# Patient Record
Sex: Male | Born: 1940 | Race: Black or African American | Hispanic: No | Marital: Married | Smoking: Current every day smoker
Health system: Southern US, Community
[De-identification: ages and names within clinical notes are randomized; demographics above are authoritative.]

## PROBLEM LIST (undated history)

## (undated) DIAGNOSIS — I10 Essential (primary) hypertension: Secondary | ICD-10-CM

## (undated) DIAGNOSIS — N183 Chronic kidney disease, stage 3 unspecified: Secondary | ICD-10-CM

## (undated) DIAGNOSIS — D472 Monoclonal gammopathy: Principal | ICD-10-CM

## (undated) DIAGNOSIS — I679 Cerebrovascular disease, unspecified: Secondary | ICD-10-CM

## (undated) DIAGNOSIS — E7800 Pure hypercholesterolemia, unspecified: Secondary | ICD-10-CM

## (undated) DIAGNOSIS — C61 Malignant neoplasm of prostate: Secondary | ICD-10-CM

## (undated) DIAGNOSIS — I639 Cerebral infarction, unspecified: Secondary | ICD-10-CM

## (undated) DIAGNOSIS — I671 Cerebral aneurysm, nonruptured: Secondary | ICD-10-CM

## (undated) HISTORY — DX: Monoclonal gammopathy: D47.2

## (undated) HISTORY — PX: CHOLECYSTECTOMY: SHX55

## (undated) HISTORY — PX: ANKLE SURGERY: SHX546

## (undated) HISTORY — PX: OTHER SURGICAL HISTORY: SHX169

---

## 2008-02-29 ENCOUNTER — Emergency Department (HOSPITAL_COMMUNITY): Admission: EM | Admit: 2008-02-29 | Discharge: 2008-02-29 | Payer: Self-pay | Admitting: Emergency Medicine

## 2009-11-12 ENCOUNTER — Emergency Department (HOSPITAL_COMMUNITY): Admission: EM | Admit: 2009-11-12 | Discharge: 2009-11-12 | Payer: Self-pay | Admitting: Emergency Medicine

## 2009-11-20 ENCOUNTER — Emergency Department (HOSPITAL_COMMUNITY): Admission: EM | Admit: 2009-11-20 | Discharge: 2009-11-20 | Payer: Self-pay | Admitting: Emergency Medicine

## 2009-12-24 ENCOUNTER — Emergency Department (HOSPITAL_COMMUNITY): Admission: EM | Admit: 2009-12-24 | Discharge: 2009-12-24 | Payer: Self-pay | Admitting: Emergency Medicine

## 2010-06-27 LAB — CBC
HCT: 38.6 % — ABNORMAL LOW (ref 39.0–52.0)
Hemoglobin: 13.3 g/dL (ref 13.0–17.0)
MCH: 31.2 pg (ref 26.0–34.0)
MCHC: 34.5 g/dL (ref 30.0–36.0)
RBC: 4.27 MIL/uL (ref 4.22–5.81)

## 2010-06-27 LAB — COMPREHENSIVE METABOLIC PANEL
Albumin: 4 g/dL (ref 3.5–5.2)
BUN: 21 mg/dL (ref 6–23)
CO2: 26 mEq/L (ref 19–32)
Calcium: 9.5 mg/dL (ref 8.4–10.5)
Chloride: 103 mEq/L (ref 96–112)
GFR calc Af Amer: 28 mL/min — ABNORMAL LOW (ref >60)
GFR calc non Af Amer: 23 mL/min — ABNORMAL LOW (ref >60)
Glucose, Bld: 98 mg/dL (ref 70–99)
Sodium: 139 mEq/L (ref 135–145)

## 2010-06-27 LAB — POCT CARDIAC MARKERS
CKMB, poc: 4.4 ng/mL (ref 1.0–8.0)
Myoglobin, poc: 234 ng/mL (ref 12–200)
Troponin i, poc: <0.05 ng/mL (ref 0.00–0.09)

## 2010-06-27 LAB — DIFFERENTIAL
Basophils Absolute: 0.1 10*3/uL (ref 0.0–0.1)
Basophils Relative: 1 % (ref 0–1)
Eosinophils Absolute: 0.1 10*3/uL (ref 0.0–0.7)
Eosinophils Relative: 1 % (ref 0–5)
Monocytes Relative: 10 % (ref 3–12)

## 2010-06-27 LAB — URINALYSIS, ROUTINE W REFLEX MICROSCOPIC
Glucose, UA: NEGATIVE mg/dL
Hgb urine dipstick: NEGATIVE
Ketones, ur: NEGATIVE mg/dL

## 2011-06-11 ENCOUNTER — Emergency Department (HOSPITAL_COMMUNITY): Payer: Medicare Other

## 2011-06-11 ENCOUNTER — Emergency Department (HOSPITAL_COMMUNITY)
Admission: EM | Admit: 2011-06-11 | Discharge: 2011-06-11 | Disposition: A | Discharge status: Home / Self Care | Payer: Medicare Other | Attending: Emergency Medicine | Admitting: Emergency Medicine

## 2011-06-11 ENCOUNTER — Encounter (HOSPITAL_COMMUNITY): Payer: Self-pay | Admitting: *Deleted

## 2011-06-11 DIAGNOSIS — R0602 Shortness of breath: Secondary | ICD-10-CM | POA: Insufficient documentation

## 2011-06-11 DIAGNOSIS — Z8673 Personal history of transient ischemic attack (TIA), and cerebral infarction without residual deficits: Secondary | ICD-10-CM | POA: Insufficient documentation

## 2011-06-11 DIAGNOSIS — J40 Bronchitis, not specified as acute or chronic: Secondary | ICD-10-CM | POA: Insufficient documentation

## 2011-06-11 DIAGNOSIS — I10 Essential (primary) hypertension: Secondary | ICD-10-CM | POA: Insufficient documentation

## 2011-06-11 DIAGNOSIS — R51 Headache: Secondary | ICD-10-CM | POA: Insufficient documentation

## 2011-06-11 HISTORY — DX: Cerebral aneurysm, nonruptured: I67.1

## 2011-06-11 HISTORY — DX: Cerebral infarction, unspecified: I63.9

## 2011-06-11 HISTORY — DX: Essential (primary) hypertension: I10

## 2011-06-11 LAB — BASIC METABOLIC PANEL
CO2: 24 mEq/L (ref 19–32)
GFR calc Af Amer: 34 mL/min — ABNORMAL LOW (ref >90)
GFR calc non Af Amer: 29 mL/min — ABNORMAL LOW (ref >90)
Sodium: 137 mEq/L (ref 135–145)

## 2011-06-11 LAB — CBC
HCT: 37.7 % — ABNORMAL LOW (ref 39.0–52.0)
Hemoglobin: 12.9 g/dL — ABNORMAL LOW (ref 13.0–17.0)
MCH: 30 pg (ref 26.0–34.0)
MCHC: 34.2 g/dL (ref 30.0–36.0)
RBC: 4.3 MIL/uL (ref 4.22–5.81)
RDW: 14.3 % (ref 11.5–15.5)
WBC: 8.3 10*3/uL (ref 4.0–10.5)

## 2011-06-11 LAB — DIFFERENTIAL
Basophils Absolute: 0 10*3/uL (ref 0.0–0.1)
Eosinophils Absolute: 0.3 10*3/uL (ref 0.0–0.7)
Lymphocytes Relative: 29 % (ref 12–46)
Lymphs Abs: 2.4 10*3/uL (ref 0.7–4.0)
Neutro Abs: 4.9 10*3/uL (ref 1.7–7.7)

## 2011-06-11 MED ORDER — AZITHROMYCIN 250 MG PO TABS
500.0000 mg | ORAL_TABLET | Freq: Once | ORAL | Status: AC
  Administered 2011-06-11: 500 mg via ORAL
  Filled 2011-06-11: qty 2

## 2011-06-11 MED ORDER — BENZONATATE 100 MG PO CAPS: 200.0000 mg | ORAL_CAPSULE | Freq: Two times a day (BID) | ORAL | Status: AC | PRN

## 2011-06-11 MED ORDER — ALBUTEROL SULFATE HFA 108 (90 BASE) MCG/ACT IN AERS: 2.0000 | INHALATION_SPRAY | RESPIRATORY_TRACT | Status: DC | PRN

## 2011-06-11 MED ORDER — AZITHROMYCIN 250 MG PO TABS: 250.0000 mg | ORAL_TABLET | Freq: Every day | ORAL | Status: AC

## 2011-06-11 NOTE — Discharge Instructions (Signed)
Your chest x-ray was normal showing no signs of pneumonia, your blood work showed that your kidneys are improved since 2011. I do want you to call your doctor in the morning to schedule a followup appointment within the next 24-48 hours to make sure that you're improving. If you should develop severe or worsening symptoms return to the emergency department immediately for repeat evaluation and treatment  RESOURCE GUIDE  Dental Problems  Patients with Medicaid: [facility] [phone] [address]                                           [phone] [address] Phone:  [phone]                                                  Phone:  [phone]  If unable to pay or uninsured, contact:  [facility] or [facility]. to become qualified for the adult dental clinic.  Chronic Pain Problems Contact [facility]  [phone] Patients need to be referred by their primary care doctor.  Insufficient Money for Medicine Contact [employer] Way:  call "211" or [facility] 8730735053.  No Primary Care Doctor Call [facility]  [phone] Other agencies that provide inexpensive medical care    [facility]  [phone]    [facility]  [phone]    [facility]  [phone]    [facility]  [phone]    [facility]  [phone]    [facility]  [phone]  Psychological Services [facility] Behavioral Health  [phone] [facility] Services  [phone] [facility] Mental Health   [phone] (emergency services [phone])  Substance Abuse Resources [facility]  [phone] [facility] [phone] [facility] [place] [phone] Floydene Flock [phone] Residential & Outpatient Substance Abuse Program  [phone]  Abuse/Neglect [facility] Child Abuse Hotline 5793217714 [facility] Child Abuse Hotline 986-139-0506 (After  Hours)  Emergency Shelter [facility] [phone]  Maternity Homes [facility] [phone] [facility] [phone]  MRSA Hotline #:   763 049 5841    [facility] Resources  [facility] [place]     [employer]                          [facility] Dept. [address] [place]                       [address]      [address]  Patrecia Pace  [facility] Phone:  [phone]                                   Phone:  [phone]                 Phone:  [place] Phone:  [place] [phone] (After Hours)

## 2011-06-11 NOTE — ED Notes (Signed)
Pt reports cold symptoms x1 week - states tonight began having headache and dizziness after taking Delsym. Pt admits to some shortness of breath, denies chest pain or fever - pt admits to productive cough w/ yellow sputum.

## 2011-06-11 NOTE — ED Provider Notes (Signed)
History     CSN: [id number]  Arrival date & time 06/11/11  0008   First MD Initiated Contact with Patient 06/11/11 [phone]      Chief Complaint  Patient presents with  . Shortness of Breath  . Headache    (Consider location/radiation/quality/duration/timing/severity/associated sxs/prior treatment) HPI Comments: 71 year old male with a history of recurrent strokes and hypertension. He presents because of coughing for 2 days and dizziness this evening. He states that he has been having some clear nasal drainage and a dry cough which is sometimes productive of yellow sputum. This cough was gradual in onset, is persistent, nothing makes better or worse, not associated with fevers. Tonight was associated with some dizziness after he took the first dose of delsym cough syrup along with his antihypertensives. He noticed it when he stood up he became dizzy and felt like he was passed out. The symptoms went away when he would sit down.  Patient is a 71 y.o. male presenting with shortness of breath and headaches. The history is provided by the patient and a relative.  Shortness of Breath  Associated symptoms include rhinorrhea, sore throat, cough and shortness of breath. Pertinent negatives include no chest pain, no fever and no wheezing.  Headache  Associated symptoms include shortness of breath. Pertinent negatives include no fever, no palpitations, no nausea and no vomiting.    Past Medical History  Diagnosis Date  . Cerebral aneurysm   . Stroke   . Hypertension     Past Surgical History  Procedure Date  . Cholecystectomy     History reviewed. No pertinent family history.  History  Substance Use Topics  . Smoking status: Former Games developer  . Smokeless tobacco: Not on file  . Alcohol Use: No      Review of Systems  Constitutional: Negative for fever and chills.  HENT: Positive for congestion, sore throat and rhinorrhea. Negative for neck pain and sinus pressure.   Eyes: Negative  for visual disturbance.  Respiratory: Positive for cough and shortness of breath. Negative for chest tightness and wheezing.   Cardiovascular: Negative for chest pain, palpitations and leg swelling.  Gastrointestinal: Negative for nausea, vomiting, diarrhea, constipation and blood in stool.  Genitourinary: Negative for dysuria.  Musculoskeletal: Negative for back pain.  Skin: Negative for rash.  Neurological: Positive for headaches.  Hematological: Negative for adenopathy.  All other systems reviewed and are negative.    Allergies  Penicillins  Home Medications   Current Outpatient Rx  Name Route Sig Dispense Refill  . CLOPIDOGREL BISULFATE 75 MG PO TABS Oral Take 75 mg by mouth daily.    Marland Kitchen DEXTROMETHORPHAN POLISTIREX ER 30 MG/5ML PO LQCR Oral Take 60 mg by mouth as needed.    . ENALAPRIL MALEATE 20 MG PO TABS Oral Take 20 mg by mouth daily.    . FUROSEMIDE 40 MG PO TABS Oral Take 40 mg by mouth daily.    Marland Kitchen LEVOCETIRIZINE DIHYDROCHLORIDE 5 MG PO TABS Oral Take 5 mg by mouth every evening.    Marland Kitchen METOPROLOL SUCCINATE ER 100 MG PO TB24 Oral Take 100 mg by mouth daily. Take with or immediately following a meal.    . NIFEDIPINE ER OSMOTIC 60 MG PO TB24 Oral Take 60 mg by mouth daily.    Marland Kitchen OMEPRAZOLE 40 MG PO CPDR Oral Take 40 mg by mouth daily.    . ALBUTEROL SULFATE HFA 108 (90 BASE) MCG/ACT IN AERS Inhalation Inhale 2 puffs into the lungs every 4 (four) hours as  needed for wheezing or shortness of breath. 1 Inhaler 3  . AZITHROMYCIN 250 MG PO TABS Oral Take 1 tablet (250 mg total) by mouth daily. 500mg  PO day 1, then 250mg  PO days 205 6 tablet 0  . BENZONATATE 100 MG PO CAPS Oral Take 2 capsules (200 mg total) by mouth 2 (two) times daily as needed for cough. 20 capsule 0    BP 146/97  Pulse 73  Temp(Src) 98.6 F (37 C) (Oral)  Resp 18  Ht 6\' 3"  (1.905 m)  Wt 326 lb (147.873 kg)  BMI 40.75 kg/m2  SpO2 97%  Physical Exam  Nursing note and vitals reviewed. Constitutional: He  appears well-developed and well-nourished. No distress.  HENT:  Head: Normocephalic and atraumatic.  Mouth/Throat: Oropharynx is clear and moist. No oropharyngeal exudate.  Eyes: Conjunctivae and EOM are normal. Pupils are equal, round, and reactive to light. Right eye exhibits no discharge. Left eye exhibits no discharge. No scleral icterus.  Neck: Normal range of motion. Neck supple. No JVD present. No thyromegaly present.  Cardiovascular: Normal rate, regular rhythm, normal heart sounds and intact distal pulses.  Exam reveals no gallop and no friction rub.   No murmur heard. Pulmonary/Chest: Effort normal and breath sounds normal. No respiratory distress. He has no wheezes. He has no rales.  Abdominal: Soft. Bowel sounds are normal. He exhibits no distension and no mass. There is no tenderness.  Musculoskeletal: Normal range of motion. He exhibits no edema and no tenderness.  Lymphadenopathy:    He has no cervical adenopathy.  Neurological: He is alert. Coordination normal.  Skin: Skin is warm and dry. No rash noted. No erythema.  Psychiatric: He has a normal mood and affect. His behavior is normal.    ED Course  Procedures (including critical care time)  Labs Reviewed  BASIC METABOLIC PANEL - Abnormal; Notable for the following:    Glucose, Bld 140 (*)    Creatinine, Ser 2.19 (*)    GFR calc non Af Amer 29 (*)    GFR calc Af Amer 34 (*)    All other components within normal limits  CBC - Abnormal; Notable for the following:    Hemoglobin 12.9 (*)    HCT 37.7 (*)    All other components within normal limits  DIFFERENTIAL   Dg Chest 2 View  06/11/2011  *RADIOLOGY REPORT*  Clinical Data: Cold symptoms and shortness of breath.  CHEST - 2 VIEW  Comparison: 12/24/2009  Findings: Two views of the chest were obtained.  Lungs are clear without focal disease.  There are slightly decreased lung volumes. The azygos shadow is more prominent and there may be a component of vascular  congestion but no edema.  No evidence for pleural effusions.  IMPRESSION: No acute chest findings.  Original Report Authenticated By: Richarda Overlie, M.D.     1. Bronchitis       MDM  Mucous membranes are moist, abdomen is soft lungs are clear heart is regular. His oxygen levels are 99% on room air, initially his blood pressure was hypertensive however on orthostatic testing his blood pressure dropped by greater than 20 mmHg, heart rate increased by greater than 15 beats per minute. This is consistent with orthostatic change. The patient is hydrated by exam and by history he does his symptoms are likely related to the medications that he took for the first time this evening. Will check basic labs including renal function, electrolytes and blood counts, will avoid IV fluid hydration as  the patient does require Lasix therapy for peripheral edema. unless orthostatic changes do not resolve.  Review of the medical records shows that the patient had a conference of metabolic panel drawn in 2011 showing renal insufficiency with a creatinine of 2.8. Today his blood work shows that his creatinine is 2.19 which is significantly improved. I have personally reviewed his x-ray and find no acute infiltrates, pneumothorax or other significant abnormalities. I agree with the radiologist interpretation. I discussed the signs with the patient who is in agreement with followup with his family doctor. We have retested his orthostatic vital signs after approximately one and a half hours of observation and he currently has no change in his blood pressure or heart rate with standing. He has been able to tolerate ambulation without difficulty, I have started him on Zithromax and we'll send him home on the following medications  Discharge Prescriptions include:  #1 Zithromax #2 Tessalon #3 albuterol MDI      Vida Roller, MD 06/11/11 [phone]

## 2011-06-11 NOTE — ED Notes (Signed)
Rx given x3 D/c instructions reviewed w/ pt and family - pt and family deny any further questions or concerns at present.  

## 2012-01-27 ENCOUNTER — Emergency Department (HOSPITAL_COMMUNITY)
Admission: EM | Admit: 2012-01-27 | Discharge: 2012-01-27 | Disposition: A | Discharge status: Home / Self Care | Payer: Medicare Other | Attending: Emergency Medicine | Admitting: Emergency Medicine

## 2012-01-27 ENCOUNTER — Ambulatory Visit (INDEPENDENT_AMBULATORY_CARE_PROVIDER_SITE_OTHER): Payer: Medicare Other | Admitting: Urology

## 2012-01-27 ENCOUNTER — Encounter (HOSPITAL_COMMUNITY): Payer: Self-pay

## 2012-01-27 DIAGNOSIS — N529 Male erectile dysfunction, unspecified: Secondary | ICD-10-CM

## 2012-01-27 DIAGNOSIS — R972 Elevated prostate specific antigen [PSA]: Secondary | ICD-10-CM

## 2012-01-27 DIAGNOSIS — I10 Essential (primary) hypertension: Secondary | ICD-10-CM | POA: Insufficient documentation

## 2012-01-27 DIAGNOSIS — Z880 Allergy status to penicillin: Secondary | ICD-10-CM | POA: Insufficient documentation

## 2012-01-27 DIAGNOSIS — M25512 Pain in left shoulder: Secondary | ICD-10-CM

## 2012-01-27 DIAGNOSIS — N400 Enlarged prostate without lower urinary tract symptoms: Secondary | ICD-10-CM

## 2012-01-27 DIAGNOSIS — Z823 Family history of stroke: Secondary | ICD-10-CM | POA: Insufficient documentation

## 2012-01-27 DIAGNOSIS — M25519 Pain in unspecified shoulder: Secondary | ICD-10-CM | POA: Insufficient documentation

## 2012-01-27 DIAGNOSIS — F17200 Nicotine dependence, unspecified, uncomplicated: Secondary | ICD-10-CM | POA: Insufficient documentation

## 2012-01-27 MED ORDER — PREDNISONE 50 MG PO TABS: 50.0000 mg | ORAL_TABLET | Freq: Every day | ORAL | Status: DC

## 2012-01-27 MED ORDER — NAPROXEN 500 MG PO TABS: 500.0000 mg | ORAL_TABLET | Freq: Two times a day (BID) | ORAL | Status: DC

## 2012-01-27 MED ORDER — KETOROLAC TROMETHAMINE 60 MG/2ML IM SOLN
60.0000 mg | Freq: Once | INTRAMUSCULAR | Status: AC
  Administered 2012-01-27: 60 mg via INTRAMUSCULAR
  Filled 2012-01-27: qty 2

## 2012-01-27 NOTE — ED Provider Notes (Signed)
History  This chart was scribed for Ryan Hutching, MD by Ladona Ridgel Day. This patient was seen in room APA10/APA10 and the patient's care was started at 0945.  CSN: [id number]  Arrival date & time 01/27/12  0945  First MD Initiated Contact with Patient 01/27/12 1049     Chief Complaint  Patient presents with  . Shoulder Pain   Patient is a 71 y.o. male presenting with shoulder pain. The history is provided by the patient. No language interpreter was used.  Shoulder Pain This is a recurrent problem. The current episode started more than 1 week ago. The problem occurs constantly. The problem has been gradually worsening. Pertinent negatives include no chest pain, no abdominal pain and no shortness of breath. The symptoms are aggravated by bending. Nothing relieves the symptoms. Treatments tried: cortisone injections. The treatment provided mild relief.   Ryan Rollins is a 71 y.o. male who presents to the Emergency Department complaining of constant gradually worsening aching left anterior shoulder pain for several weeks. He was diagnosed with left shoulder bursitis and has been seeing his PCP who has given x2 cortisone injections on 2 different visits which have mildly improved his symptoms but he was seen at ER and given hydrocodone which has not improved his pain. He denies any injuries or trauma to his left shoulder or arm.  His PCP is Dr. Iran Ouch  Past Medical History  Diagnosis Date  . Cerebral aneurysm   . Stroke   . Hypertension     Past Surgical History  Procedure Date  . Cholecystectomy     No family history on file.  History  Substance Use Topics  . Smoking status: Current Every Day Smoker    Types: Cigarettes  . Smokeless tobacco: Not on file  . Alcohol Use: No     Review of Systems  Constitutional: Negative for fever and chills.  HENT: Negative for congestion.   Respiratory: Negative for shortness of breath.   Cardiovascular: Negative for chest pain.    Gastrointestinal: Negative for nausea, vomiting and abdominal pain.  Musculoskeletal:       Left shoulder tenderness  Neurological: Positive for numbness (tingling left hand). Negative for weakness.  All other systems reviewed and are negative.    Allergies  Penicillins  Home Medications   Current Outpatient Rx  Name Route Sig Dispense Refill  . ALBUTEROL SULFATE HFA 108 (90 BASE) MCG/ACT IN AERS Inhalation Inhale 2 puffs into the lungs every 4 (four) hours as needed for wheezing or shortness of breath. 1 Inhaler 3  . CLOPIDOGREL BISULFATE 75 MG PO TABS Oral Take 75 mg by mouth daily.    Marland Kitchen DEXTROMETHORPHAN POLISTIREX ER 30 MG/5ML PO LQCR Oral Take 60 mg by mouth as needed.    . ENALAPRIL MALEATE 20 MG PO TABS Oral Take 20 mg by mouth daily.    . FUROSEMIDE 40 MG PO TABS Oral Take 40 mg by mouth daily.    Marland Kitchen LEVOCETIRIZINE DIHYDROCHLORIDE 5 MG PO TABS Oral Take 5 mg by mouth every evening.    Marland Kitchen METOPROLOL SUCCINATE ER 100 MG PO TB24 Oral Take 100 mg by mouth daily. Take with or immediately following a meal.    . NIFEDIPINE ER OSMOTIC 60 MG PO TB24 Oral Take 60 mg by mouth daily.    Marland Kitchen OMEPRAZOLE 40 MG PO CPDR Oral Take 40 mg by mouth daily.      Triage Vitals: BP 147/82  Pulse 84  Temp 97.9 F (36.6 C)  Resp 20  Ht 6\' 3"  (1.905 m)  Wt 314 lb (142.429 kg)  BMI 39.25 kg/m2  SpO2 100%  Physical Exam  Nursing note and vitals reviewed. Constitutional: He is oriented to person, place, and time. He appears well-developed and well-nourished.  HENT:  Head: Normocephalic and atraumatic.  Eyes: Conjunctivae normal and EOM are normal. Pupils are equal, round, and reactive to light.  Neck: Normal range of motion. Neck supple.  Cardiovascular: Normal rate, regular rhythm and normal heart sounds.   Pulmonary/Chest: Effort normal and breath sounds normal. No respiratory distress.  Abdominal: Soft. Bowel sounds are normal.  Musculoskeletal: Normal range of motion. He exhibits tenderness.        Left anterior shoulder tenderness and lateral epicondyle tenderness  Neurological: He is alert and oriented to person, place, and time.  Skin: Skin is warm and dry.  Psychiatric: He has a normal mood and affect.    ED Course  Procedures (including critical care time) DIAGNOSTIC STUDIES: Oxygen Saturation is 100% on room air, normal by my interpretation.    COORDINATION OF CARE: At 1110 AM Discussed treatment plan with patient which includes toradol. Patient agrees.   Labs Reviewed - No data to display No results found.   No diagnosis found.    MDM    Continued left shoulder pain worse with range of motion. Ex Naprosyn 500 mg #30 and prednisone 50 mg x7 days. Referral to Dr. Romeo Apple     I personally performed the services described in this documentation, which was scribed in my presence. The recorded information has been reviewed and considered.          Ryan Hutching, MD 01/27/12 [phone]

## 2012-01-27 NOTE — ED Notes (Signed)
Pt reports bursitis to left shoulder for several weeks, pain left after injection from doctor pain has now returned and hydrocodone is not working.

## 2012-02-17 ENCOUNTER — Other Ambulatory Visit: Payer: Self-pay | Admitting: Urology

## 2012-02-17 DIAGNOSIS — R972 Elevated prostate specific antigen [PSA]: Secondary | ICD-10-CM

## 2012-03-02 ENCOUNTER — Emergency Department (HOSPITAL_COMMUNITY)
Admission: EM | Admit: 2012-03-02 | Discharge: 2012-03-02 | Disposition: A | Discharge status: Home / Self Care | Payer: Medicare Other | Attending: Emergency Medicine | Admitting: Emergency Medicine

## 2012-03-02 ENCOUNTER — Other Ambulatory Visit: Payer: Self-pay | Admitting: Urology

## 2012-03-02 ENCOUNTER — Encounter (HOSPITAL_COMMUNITY): Payer: Self-pay | Admitting: *Deleted

## 2012-03-02 ENCOUNTER — Ambulatory Visit (HOSPITAL_COMMUNITY)
Admission: RE | Admit: 2012-03-02 | Discharge: 2012-03-02 | Disposition: A | Discharge status: Home / Self Care | Payer: Medicare Other | Source: Ambulatory Visit | Attending: Urology | Admitting: Urology

## 2012-03-02 DIAGNOSIS — Z79899 Other long term (current) drug therapy: Secondary | ICD-10-CM | POA: Insufficient documentation

## 2012-03-02 DIAGNOSIS — I10 Essential (primary) hypertension: Secondary | ICD-10-CM | POA: Insufficient documentation

## 2012-03-02 DIAGNOSIS — Z8673 Personal history of transient ischemic attack (TIA), and cerebral infarction without residual deficits: Secondary | ICD-10-CM | POA: Insufficient documentation

## 2012-03-02 DIAGNOSIS — R972 Elevated prostate specific antigen [PSA]: Secondary | ICD-10-CM

## 2012-03-02 DIAGNOSIS — F17200 Nicotine dependence, unspecified, uncomplicated: Secondary | ICD-10-CM | POA: Insufficient documentation

## 2012-03-02 DIAGNOSIS — Z7901 Long term (current) use of anticoagulants: Secondary | ICD-10-CM | POA: Insufficient documentation

## 2012-03-02 DIAGNOSIS — I671 Cerebral aneurysm, nonruptured: Secondary | ICD-10-CM | POA: Insufficient documentation

## 2012-03-02 NOTE — ED Notes (Signed)
Patient reports having prostate biopsy to in ER. During procedure and after blood pressure elevated sent over to ER. Patient reports pain and anxiety during procedure but reports feeling all right now.

## 2012-03-02 NOTE — ED Notes (Signed)
Had prostate procedure as OP by Dr Hillis Range,   Sent to ER due to elevated BP.

## 2012-03-02 NOTE — Progress Notes (Signed)
[place] Transrectal Complete started at 1245.  10 cc of  1% Lidocaine injected via spinal needle.  12 samples obtained and sent to pathology.  Pt tolerated procedure well.  BP ranging from 186/115 to 210/140.  Dr Retta Diones notifed of elevated BP.  Pt transferred to the Emergency Room.

## 2012-03-02 NOTE — ED Provider Notes (Signed)
History  This chart was scribed for Geoffery Lyons, MD by Manuela Schwartz, ED scribe. This patient was seen in room APA09/APA09 and the patient's care was started at 1311.   CSN: [id number]  Arrival date & time 03/02/12  1311   None     Chief Complaint  Patient presents with  . Hypertension   The history is provided by the patient. No language interpreter was used.   Ryan Rollins is a 71 y.o. male who presents to the Emergency Department complaining of elevated blood pressure this PM after he had a prostate biopsy procedure as OP by Dr. Hillis Range and states he felt anxious, and nervous about his procedure. He states that he feels less anxious now that the procedure is over and he thinks that is what caused his blood pressure to rise. It was reportedly 198/89 at procedure.   Past Medical History  Diagnosis Date  . Cerebral aneurysm   . Stroke   . Hypertension     Past Surgical History  Procedure Date  . Cholecystectomy   . Ankle surgery   . Prostate surgery  biopsy     History reviewed. No pertinent family history.  History  Substance Use Topics  . Smoking status: Current Every Day Smoker    Types: Cigarettes  . Smokeless tobacco: Not on file  . Alcohol Use: No      Review of Systems  Constitutional: Negative for fever and chills.       Elevated blood pressure  Respiratory: Negative for shortness of breath.   Gastrointestinal: Negative for nausea and vomiting.  Neurological: Negative for weakness.  All other systems reviewed and are negative.   A complete 10 system review of systems was obtained and all systems are negative except as noted in the HPI and PMH.   Allergies  Morphine and related and Penicillins  Home Medications   Current Outpatient Rx  Name  Route  Sig  Dispense  Refill  . ALBUTEROL SULFATE HFA 108 (90 BASE) MCG/ACT IN AERS   Inhalation   Inhale 2 puffs into the lungs every 4 (four) hours as needed for wheezing or shortness of breath.   1  Inhaler   3   . ATORVASTATIN CALCIUM 10 MG PO TABS   Oral   Take 10 mg by mouth daily.         Marland Kitchen CLOPIDOGREL BISULFATE 75 MG PO TABS   Oral   Take 75 mg by mouth daily.         . ENALAPRIL MALEATE 20 MG PO TABS   Oral   Take 20 mg by mouth daily.         . FUROSEMIDE 40 MG PO TABS   Oral   Take 40 mg by mouth daily.         Marland Kitchen HYDROCODONE-ACETAMINOPHEN 10-650 MG PO TABS   Oral   Take 1 tablet by mouth every 8 (eight) hours as needed. pain         . LEVOCETIRIZINE DIHYDROCHLORIDE 5 MG PO TABS   Oral   Take 5 mg by mouth every evening.         Marland Kitchen METOPROLOL SUCCINATE ER 100 MG PO TB24   Oral   Take 100 mg by mouth 2 (two) times daily. Take with or immediately following a meal.         . NAPROXEN 500 MG PO TABS   Oral   Take 1 tablet (500 mg total) by mouth 2 (two)  times daily.   30 tablet   0   . NIFEDIPINE ER OSMOTIC 60 MG PO TB24   Oral   Take 60 mg by mouth daily.         Marland Kitchen OMEPRAZOLE 40 MG PO CPDR   Oral   Take 40 mg by mouth every morning.         Marland Kitchen PANTOPRAZOLE SODIUM 40 MG PO TBEC   Oral   Take 40 mg by mouth at bedtime.         Marland Kitchen PREDNISONE 50 MG PO TABS   Oral   Take 1 tablet (50 mg total) by mouth daily.   7 tablet   1   . TAMSULOSIN HCL 0.4 MG PO CAPS   Oral   Take 0.4 mg by mouth daily after breakfast.           BP 149/85  Pulse 78  Temp 97.4 F (36.3 C) (Oral)  Resp 18  Ht 6\' 3"  (1.905 m)  Wt 314 lb (142.429 kg)  BMI 39.25 kg/m2  SpO2 98%  Physical Exam  Nursing note and vitals reviewed. Constitutional: He is oriented to person, place, and time. He appears well-developed and well-nourished. No distress.  HENT:  Head: Normocephalic and atraumatic.  Eyes: EOM are normal.  Neck: Neck supple. No tracheal deviation present.  Cardiovascular: Normal rate, regular rhythm and normal heart sounds.   No murmur heard. Pulmonary/Chest: Effort normal and breath sounds normal. No respiratory distress. He has no wheezes.  He has no rales.  Abdominal: Soft. Bowel sounds are normal. He exhibits no distension. There is no tenderness. There is no rebound and no guarding.  Musculoskeletal: Normal range of motion.  Neurological: He is alert and oriented to person, place, and time.  Skin: Skin is warm and dry.  Psychiatric: He has a normal mood and affect. His behavior is normal.    ED Course  Procedures (including critical care time) DIAGNOSTIC STUDIES: Oxygen Saturation is 98% on room air, normal by my interpretation.    COORDINATION OF CARE: At 135 PM Discussed treatment plan with patient which includes recheck of blood pressure. Patient agrees.   Labs Reviewed - No data to display No results found.   1. Hypertension       MDM  The patient presents here at the request of the OR.  He was having an outpatient prostate biopsy.  He was very anxious leading up to and during the procedure.  His bp was in the 190's and was told to come here afterward.  He now feels fine and has no complaints.  His bp is 149/85.  I believe that his bp was elevated due to anxiety/pain related to the procedure.  He wants to go home and I feel as though this is appropriate.  He will monitor his bp at home and return for any problems.    I personally performed the services described in this documentation, which was scribed in my presence. The recorded information has been reviewed and is accurate.            Geoffery Lyons, MD 03/02/12 [phone]

## 2012-03-08 ENCOUNTER — Other Ambulatory Visit: Payer: Self-pay | Admitting: Urology

## 2012-03-08 DIAGNOSIS — C61 Malignant neoplasm of prostate: Secondary | ICD-10-CM

## 2012-03-09 ENCOUNTER — Encounter: Payer: Self-pay | Admitting: Urology

## 2012-03-31 ENCOUNTER — Encounter (HOSPITAL_COMMUNITY)
Admission: RE | Admit: 2012-03-31 | Discharge: 2012-03-31 | Disposition: A | Discharge status: Home / Self Care | Payer: PRIVATE HEALTH INSURANCE | Source: Ambulatory Visit | Attending: Urology | Admitting: Urology

## 2012-03-31 DIAGNOSIS — C61 Malignant neoplasm of prostate: Secondary | ICD-10-CM

## 2012-03-31 MED ORDER — TECHNETIUM TC 99M MEDRONATE IV KIT
25.0000 | PACK | Freq: Once | INTRAVENOUS | Status: AC | PRN
  Administered 2012-03-31: 25 via INTRAVENOUS

## 2012-05-11 ENCOUNTER — Ambulatory Visit (INDEPENDENT_AMBULATORY_CARE_PROVIDER_SITE_OTHER): Payer: Medicare Other | Admitting: Urology

## 2012-05-11 DIAGNOSIS — C61 Malignant neoplasm of prostate: Secondary | ICD-10-CM

## 2012-08-24 ENCOUNTER — Ambulatory Visit (INDEPENDENT_AMBULATORY_CARE_PROVIDER_SITE_OTHER): Payer: PRIVATE HEALTH INSURANCE | Admitting: Urology

## 2012-08-24 DIAGNOSIS — C61 Malignant neoplasm of prostate: Secondary | ICD-10-CM

## 2012-12-14 ENCOUNTER — Ambulatory Visit (INDEPENDENT_AMBULATORY_CARE_PROVIDER_SITE_OTHER): Payer: PRIVATE HEALTH INSURANCE | Admitting: Urology

## 2012-12-14 DIAGNOSIS — C61 Malignant neoplasm of prostate: Secondary | ICD-10-CM

## 2013-03-13 ENCOUNTER — Encounter (HOSPITAL_COMMUNITY): Payer: Self-pay | Admitting: Emergency Medicine

## 2013-03-13 ENCOUNTER — Emergency Department (HOSPITAL_COMMUNITY): Payer: PRIVATE HEALTH INSURANCE

## 2013-03-13 ENCOUNTER — Emergency Department (HOSPITAL_COMMUNITY)
Admission: EM | Admit: 2013-03-13 | Discharge: 2013-03-13 | Disposition: A | Discharge status: Home / Self Care | Payer: PRIVATE HEALTH INSURANCE | Attending: Emergency Medicine | Admitting: Emergency Medicine

## 2013-03-13 DIAGNOSIS — Z8669 Personal history of other diseases of the nervous system and sense organs: Secondary | ICD-10-CM | POA: Insufficient documentation

## 2013-03-13 DIAGNOSIS — F17200 Nicotine dependence, unspecified, uncomplicated: Secondary | ICD-10-CM | POA: Insufficient documentation

## 2013-03-13 DIAGNOSIS — R0602 Shortness of breath: Secondary | ICD-10-CM | POA: Insufficient documentation

## 2013-03-13 DIAGNOSIS — I10 Essential (primary) hypertension: Secondary | ICD-10-CM | POA: Insufficient documentation

## 2013-03-13 DIAGNOSIS — R079 Chest pain, unspecified: Secondary | ICD-10-CM | POA: Insufficient documentation

## 2013-03-13 DIAGNOSIS — Z8673 Personal history of transient ischemic attack (TIA), and cerebral infarction without residual deficits: Secondary | ICD-10-CM | POA: Insufficient documentation

## 2013-03-13 DIAGNOSIS — Z79899 Other long term (current) drug therapy: Secondary | ICD-10-CM | POA: Insufficient documentation

## 2013-03-13 DIAGNOSIS — Z880 Allergy status to penicillin: Secondary | ICD-10-CM | POA: Insufficient documentation

## 2013-03-13 DIAGNOSIS — Z7902 Long term (current) use of antithrombotics/antiplatelets: Secondary | ICD-10-CM | POA: Insufficient documentation

## 2013-03-13 HISTORY — DX: Pure hypercholesterolemia, unspecified: E78.00

## 2013-03-13 LAB — CBC WITH DIFFERENTIAL/PLATELET
Eosinophils Absolute: 0.2 10*3/uL (ref 0.0–0.7)
Eosinophils Relative: 3 % (ref 0–5)
HCT: 36.6 % — ABNORMAL LOW (ref 39.0–52.0)
Lymphocytes Relative: 21 % (ref 12–46)
Lymphs Abs: 1 10*3/uL (ref 0.7–4.0)
MCH: 29.9 pg (ref 26.0–34.0)
MCV: 89.1 fL (ref 78.0–100.0)
Monocytes Absolute: 0.4 10*3/uL (ref 0.1–1.0)
Monocytes Relative: 7 % (ref 3–12)
RBC: 4.11 MIL/uL — ABNORMAL LOW (ref 4.22–5.81)
WBC: 4.8 10*3/uL (ref 4.0–10.5)

## 2013-03-13 LAB — BASIC METABOLIC PANEL
BUN: 20 mg/dL (ref 6–23)
CO2: 22 mEq/L (ref 19–32)
Calcium: 10.2 mg/dL (ref 8.4–10.5)
Creatinine, Ser: 2.02 mg/dL — ABNORMAL HIGH (ref 0.50–1.35)
GFR calc non Af Amer: 32 mL/min — ABNORMAL LOW (ref >90)
Glucose, Bld: 118 mg/dL — ABNORMAL HIGH (ref 70–99)
Sodium: 136 mEq/L (ref 135–145)

## 2013-03-13 LAB — TROPONIN I: Troponin I: <0.3 ng/mL (ref <0.30)

## 2013-03-13 MED ORDER — HYDROCODONE-ACETAMINOPHEN 5-325 MG PO TABS
2.0000 | ORAL_TABLET | Freq: Once | ORAL | Status: AC
  Administered 2013-03-13: 2 via ORAL
  Filled 2013-03-13: qty 2

## 2013-03-13 MED ORDER — HYDROCODONE-ACETAMINOPHEN 5-325 MG PO TABS: 2.0000 | ORAL_TABLET | ORAL | Status: DC | PRN

## 2013-03-13 NOTE — ED Notes (Signed)
Pt c/o centralized chest pain since 3am this morning. Pt describes pain as "pressure". Pt also reports SOB. Pain becomes worse when pt lays flat.

## 2013-03-13 NOTE — ED Provider Notes (Signed)
CSN: [id number]     Arrival date & time 03/13/13  4098 History   This chart was scribed for Geoffery Lyons, MD, by Yevette Edwards, ED Scribe. This patient was seen in room APA04/APA04 and the patient's care was started at 8:11 AM. None    No chief complaint on file.   The history is provided by the patient. No language interpreter was used.   HPI Comments: Ryan Rollins is a 72 y.o. male, with a h/o HTN and stroke, who presents to the Emergency Department complaining of acute left-sided chest pain which woke him from sleep five hours ago. The pt characterizes the pain as "something sitting on his chest," and he rates the pressure as 8/10.  As associated symptoms, the pt has also experienced SOB. He states improvement to the pressure upon sitting up, and he states the pain has improved since onset. He denies any lower extremity swelling increased from baseline, nausea, or abdominal pain. The pt reports that yesterday everything was baseline. He denies any cardiac issues, but he has a family history of cardiac problems. The pt also denies any strenuous activities. The pt denies DM or high cholesterol. The pt takes plavix, and his last dosage was this morning. He is a current smoker.   Past Medical History  Diagnosis Date  . Cerebral aneurysm   . Stroke   . Hypertension    Past Surgical History  Procedure Laterality Date  . Cholecystectomy    . Ankle surgery    . Prostate surgery  biopsy     No family history on file. History  Substance Use Topics  . Smoking status: Current Every Day Smoker    Types: Cigarettes  . Smokeless tobacco: Not on file  . Alcohol Use: No    Review of Systems  Constitutional: Negative for fever and chills.  Respiratory: Positive for shortness of breath.   Cardiovascular: Positive for chest pain. Negative for leg swelling.  Gastrointestinal: Negative for nausea and abdominal pain.  All other systems reviewed and are negative.   Allergies  Morphine and  related and Penicillins  Home Medications   Current Outpatient Rx  Name  Route  Sig  Dispense  Refill  . EXPIRED: albuterol (PROVENTIL HFA;VENTOLIN HFA) 108 (90 BASE) MCG/ACT inhaler   Inhalation   Inhale 2 puffs into the lungs every 4 (four) hours as needed for wheezing or shortness of breath.   1 Inhaler   3   . atorvastatin (LIPITOR) 10 MG tablet   Oral   Take 10 mg by mouth daily.         . clopidogrel (PLAVIX) 75 MG tablet   Oral   Take 75 mg by mouth daily.         . enalapril (VASOTEC) 20 MG tablet   Oral   Take 20 mg by mouth daily.         . furosemide (LASIX) 40 MG tablet   Oral   Take 40 mg by mouth daily.         Marland Kitchen HYDROcodone-acetaminophen (LORCET) 10-650 MG per tablet   Oral   Take 1 tablet by mouth every 8 (eight) hours as needed. pain         . levocetirizine (XYZAL) 5 MG tablet   Oral   Take 5 mg by mouth every evening.         . metoprolol succinate (TOPROL-XL) 100 MG 24 hr tablet   Oral   Take 100 mg by mouth 2 (  two) times daily. Take with or immediately following a meal.         . naproxen (NAPROSYN) 500 MG tablet   Oral   Take 1 tablet (500 mg total) by mouth 2 (two) times daily.   30 tablet   0   . NIFEdipine (PROCARDIA XL/ADALAT-CC) 60 MG 24 hr tablet   Oral   Take 60 mg by mouth daily.         Marland Kitchen omeprazole (PRILOSEC) 40 MG capsule   Oral   Take 40 mg by mouth every morning.         . pantoprazole (PROTONIX) 40 MG tablet   Oral   Take 40 mg by mouth at bedtime.         . predniSONE (DELTASONE) 50 MG tablet   Oral   Take 1 tablet (50 mg total) by mouth daily.   7 tablet   1   . Tamsulosin HCl (FLOMAX) 0.4 MG CAPS   Oral   Take 0.4 mg by mouth daily after breakfast.          Triage Vitals:  BP 147/89  Pulse 119  Temp(Src) 98.6 F (37 C) (Oral)  Resp 18  SpO2 88%  Physical Exam  Nursing note and vitals reviewed. Constitutional: He is oriented to person, place, and time. He appears well-developed  and well-nourished. No distress.  HENT:  Head: Normocephalic and atraumatic.  Eyes: EOM are normal.  Neck: Normal range of motion. Neck supple. No tracheal deviation present.  Cardiovascular: Normal rate, regular rhythm, normal heart sounds and intact distal pulses.   No murmur heard. Pulmonary/Chest: Effort normal. No respiratory distress. He has no wheezes. He has no rales.  Musculoskeletal: Normal range of motion. He exhibits no tenderness.  Neurological: He is alert and oriented to person, place, and time.  Skin: Skin is warm and dry.  Psychiatric: He has a normal mood and affect. His behavior is normal.    ED Course  Procedures (including critical care time)  DIAGNOSTIC STUDIES: Oxygen Saturation is 88% on room air, low by my interpretation.    COORDINATION OF CARE:  8:19 AM- Discussed treatment plan with patient, and the patient agreed to the plan.   Labs Review Labs Reviewed  CBC WITH DIFFERENTIAL - Abnormal; Notable for the following:    RBC 4.11 (*)    Hemoglobin 12.3 (*)    HCT 36.6 (*)    All other components within normal limits  BASIC METABOLIC PANEL - Abnormal; Notable for the following:    Glucose, Bld 118 (*)    Creatinine, Ser 2.02 (*)    GFR calc non Af Amer 32 (*)    GFR calc Af Amer 37 (*)    All other components within normal limits  TROPONIN I  TROPONIN I   Imaging Review Dg Chest Portable 1 View  03/13/2013   CLINICAL DATA:  Chest pain  EXAM: PORTABLE CHEST - 1 VIEW  COMPARISON:  06/11/2011  FINDINGS: Heart size upper limits normal. Atheromatous aorta. Lungs are clear. No definite effusion although the left lateral costophrenic angle is excluded. Regional bones unremarkable.  IMPRESSION: No acute cardiopulmonary disease.   Electronically Signed   By: Oley Balm M.D.   On: 03/13/2013 08:28    EKG Interpretation    Date/Time:  Sunday March 13 2013 07:56:05 EST Ventricular Rate:  81 PR Interval:  154 QRS Duration: 82 QT  Interval:  370 QTC Calculation: 429 R Axis:   -2 Text Interpretation:  Sinus rhythm with Premature atrial complexes Otherwise normal ECG When compared with ECG of 24-Dec-2009 12:02, Premature atrial complexes are now Present Nonspecific T wave abnormality has replaced inverted T waves in Lateral leads Confirmed by DELOS  MD, Courage Biglow ([id number]) on 03/13/2013 8:42:06 AM            MDM  No diagnosis found. Patient is a 72 year old male with no prior cardiac history. Sensitive complaints of discomfort in the left lower chest that woke him from sleep. He denies any shortness of breath, nausea, or radiation to the arm or jaw. States his discomfort is worse when he exhales and improved when he inhales. He is tender to palpation over the left lower cage. Workup reveals negative troponin x2 along with an unchanged EKG. His chest x-ray is clear and I suspect his symptoms are musculoskeletal in nature. His oxygen concentrations are 100% on room air and I doubt pulmonary embolism. He will be discharged home with instructions to return if his symptoms worsen or change.  I personally performed the services described in this documentation, which was scribed in my presence. The recorded information has been reviewed and is accurate.       Geoffery Lyons, MD 03/13/13 1106

## 2013-03-13 NOTE — ED Notes (Signed)
Pt reports woke up with heaviness in chest at 3am this am and some SOB.  Denies nausea.  Reports pain is better when he sits up.

## 2013-03-15 ENCOUNTER — Ambulatory Visit: Payer: PRIVATE HEALTH INSURANCE | Admitting: Urology

## 2013-04-05 ENCOUNTER — Ambulatory Visit (INDEPENDENT_AMBULATORY_CARE_PROVIDER_SITE_OTHER): Payer: PRIVATE HEALTH INSURANCE | Admitting: Urology

## 2013-04-05 DIAGNOSIS — C61 Malignant neoplasm of prostate: Secondary | ICD-10-CM

## 2013-05-28 ENCOUNTER — Encounter (HOSPITAL_COMMUNITY): Payer: Self-pay | Admitting: Emergency Medicine

## 2013-05-28 ENCOUNTER — Emergency Department (HOSPITAL_COMMUNITY)
Admission: EM | Admit: 2013-05-28 | Discharge: 2013-05-28 | Disposition: A | Discharge status: Home / Self Care | Payer: PRIVATE HEALTH INSURANCE | Attending: Emergency Medicine | Admitting: Emergency Medicine

## 2013-05-28 DIAGNOSIS — F17200 Nicotine dependence, unspecified, uncomplicated: Secondary | ICD-10-CM | POA: Insufficient documentation

## 2013-05-28 DIAGNOSIS — R197 Diarrhea, unspecified: Secondary | ICD-10-CM

## 2013-05-28 DIAGNOSIS — Z8673 Personal history of transient ischemic attack (TIA), and cerebral infarction without residual deficits: Secondary | ICD-10-CM | POA: Insufficient documentation

## 2013-05-28 DIAGNOSIS — I10 Essential (primary) hypertension: Secondary | ICD-10-CM | POA: Insufficient documentation

## 2013-05-28 DIAGNOSIS — Z79899 Other long term (current) drug therapy: Secondary | ICD-10-CM | POA: Insufficient documentation

## 2013-05-28 DIAGNOSIS — Z7902 Long term (current) use of antithrombotics/antiplatelets: Secondary | ICD-10-CM | POA: Insufficient documentation

## 2013-05-28 DIAGNOSIS — E7800 Pure hypercholesterolemia, unspecified: Secondary | ICD-10-CM | POA: Insufficient documentation

## 2013-05-28 DIAGNOSIS — Z880 Allergy status to penicillin: Secondary | ICD-10-CM | POA: Insufficient documentation

## 2013-05-28 LAB — COMPREHENSIVE METABOLIC PANEL
ALK PHOS: 167 U/L — AB (ref 39–117)
ALT: 70 U/L — AB (ref 0–53)
AST: 65 U/L — AB (ref 0–37)
Albumin: 3.7 g/dL (ref 3.5–5.2)
BUN: 28 mg/dL — ABNORMAL HIGH (ref 6–23)
CO2: 22 mEq/L (ref 19–32)
Calcium: 9.4 mg/dL (ref 8.4–10.5)
Chloride: 104 mEq/L (ref 96–112)
Creatinine, Ser: 2.69 mg/dL — ABNORMAL HIGH (ref 0.50–1.35)
GFR calc non Af Amer: 22 mL/min — ABNORMAL LOW (ref >90)
GFR, EST AFRICAN AMERICAN: 26 mL/min — AB (ref >90)
GLUCOSE: 108 mg/dL — AB (ref 70–99)
POTASSIUM: 3.9 meq/L (ref 3.7–5.3)
SODIUM: 139 meq/L (ref 137–147)
TOTAL PROTEIN: 6.7 g/dL (ref 6.0–8.3)
Total Bilirubin: 0.3 mg/dL (ref 0.3–1.2)

## 2013-05-28 LAB — URINALYSIS, ROUTINE W REFLEX MICROSCOPIC
BILIRUBIN URINE: NEGATIVE
GLUCOSE, UA: NEGATIVE mg/dL
HGB URINE DIPSTICK: NEGATIVE
KETONES UR: NEGATIVE mg/dL
Leukocytes, UA: NEGATIVE
NITRITE: NEGATIVE
PH: 5.5 (ref 5.0–8.0)
Protein, ur: NEGATIVE mg/dL
SPECIFIC GRAVITY, URINE: 1.025 (ref 1.005–1.030)
Urobilinogen, UA: 0.2 mg/dL (ref 0.0–1.0)

## 2013-05-28 LAB — CBC WITH DIFFERENTIAL/PLATELET
BASOS ABS: 0 10*3/uL (ref 0.0–0.1)
Basophils Relative: 0 % (ref 0–1)
EOS ABS: 0.1 10*3/uL (ref 0.0–0.7)
Eosinophils Relative: 4 % (ref 0–5)
HCT: 34.7 % — ABNORMAL LOW (ref 39.0–52.0)
Hemoglobin: 11.8 g/dL — ABNORMAL LOW (ref 13.0–17.0)
LYMPHS ABS: 0.9 10*3/uL (ref 0.7–4.0)
LYMPHS PCT: 28 % (ref 12–46)
MCH: 30 pg (ref 26.0–34.0)
MCHC: 34 g/dL (ref 30.0–36.0)
MCV: 88.3 fL (ref 78.0–100.0)
Monocytes Absolute: 0.4 10*3/uL (ref 0.1–1.0)
Monocytes Relative: 11 % (ref 3–12)
NEUTROS PCT: 58 % (ref 43–77)
Neutro Abs: 1.9 10*3/uL (ref 1.7–7.7)
PLATELETS: 204 10*3/uL (ref 150–400)
RBC: 3.93 MIL/uL — AB (ref 4.22–5.81)
RDW: 14.8 % (ref 11.5–15.5)
WBC: 3.3 10*3/uL — AB (ref 4.0–10.5)

## 2013-05-28 LAB — CLOSTRIDIUM DIFFICILE BY PCR: CDIFFPCR: NEGATIVE

## 2013-05-28 MED ORDER — SODIUM CHLORIDE 0.9 % IV BOLUS (SEPSIS)
1000.0000 mL | Freq: Once | INTRAVENOUS | Status: AC
  Administered 2013-05-28: 1000 mL via INTRAVENOUS

## 2013-05-28 MED ORDER — DIPHENOXYLATE-ATROPINE 2.5-0.025 MG PO TABS: 1.0000 | ORAL_TABLET | Freq: Four times a day (QID) | ORAL | Status: DC | PRN

## 2013-05-28 MED ORDER — DIPHENOXYLATE-ATROPINE 2.5-0.025 MG PO TABS
1.0000 | ORAL_TABLET | Freq: Once | ORAL | Status: AC
  Administered 2013-05-28: 1 via ORAL
  Filled 2013-05-28: qty 1

## 2013-05-28 NOTE — ED Provider Notes (Signed)
This chart was scribed for [place], DO, by Neta Ehlers, ED Scribe. This patient was seen in room APA08/APA08 and the patient's care was started at 6:27 PM.   TIME SEEN: 6:27 PM  CHIEF COMPLAINT: Diarrhea   HPI:  HPI Comments: [place] Ryan Rollins is a 73 y.o. Male, with a h/o HTN and strokes, who presents to the Emergency Department complaining of three days of green, watery diarrhea which began shortly after taking two dulcolax on Thursday, 2 days ago. He states he had more than ten episodes of diarrhea yesterday. The pt has treated the diarrhea with two Immodium yesterday without relief. He denies nausea, emesis, hematochezia, abdominal pain, fever, or melena.  He denies recent hospitalization, recent sick contacts, and recent antibiotic usage. The pt also denies recent international travel or camping. No history of sick contacts. He has a h/o cholecystectomy. He denies DM or a h/o heart failure.    ROS: See HPI Constitutional: no fever  Eyes: no drainage   ENT: no runny nose Cardiovascular:  no chest pain  Resp: no SOB  GI: positive diarrhea, no vomiting, no abdominal pain, no hematochezia GU: no dysuria Integumentary: no rash  Allergy: no hives  Musculoskeletal: no leg swelling  Neurological: no slurred speech ROS otherwise negative  PAST MEDICAL HISTORY/PAST SURGICAL HISTORY:  Past Medical History  Diagnosis Date  . Cerebral aneurysm   . Stroke   . Hypertension   . Hypercholesterolemia     MEDICATIONS:  Prior to Admission medications   Medication Sig Start Date End Date Taking? Authorizing Provider  albuterol (PROVENTIL HFA;VENTOLIN HFA) 108 (90 BASE) MCG/ACT inhaler Inhale 2 puffs into the lungs every 4 (four) hours as needed for wheezing or shortness of breath. 06/11/11 06/10/12  Johnna Acosta, MD  albuterol (PROVENTIL HFA;VENTOLIN HFA) 108 (90 BASE) MCG/ACT inhaler Inhale 2 puffs into the lungs every 6 (six) hours as needed for wheezing or shortness of breath.     Historical Provider, MD  atorvastatin (LIPITOR) 10 MG tablet Take 10 mg by mouth daily.    Historical Provider, MD  clopidogrel (PLAVIX) 75 MG tablet Take 75 mg by mouth daily.    Historical Provider, MD  Docusate Calcium (STOOL SOFTENER PO) Take 1 capsule by mouth daily as needed (constipation).    Historical Provider, MD  enalapril (VASOTEC) 20 MG tablet Take 20 mg by mouth 2 (two) times daily.     Historical Provider, MD  furosemide (LASIX) 40 MG tablet Take 20-40 mg by mouth daily as needed for fluid (take 1/2-1 tablet daily as needed.).     Historical Provider, MD  HYDROcodone-acetaminophen (NORCO) 10-325 MG per tablet Take 1 tablet by mouth 3 (three) times daily as needed.    Historical Provider, MD  HYDROcodone-acetaminophen (NORCO) 5-325 MG per tablet Take 2 tablets by mouth every 4 (four) hours as needed. 03/13/13   Veryl Speak, MD  levocetirizine (XYZAL) 5 MG tablet Take 5 mg by mouth every evening.    Historical Provider, MD  NIFEdipine (PROCARDIA-XL/ADALAT CC) 60 MG 24 hr tablet Take 60 mg by mouth 2 (two) times daily.    Historical Provider, MD  omeprazole (PRILOSEC) 40 MG capsule Take 40 mg by mouth every morning.    Historical Provider, MD  Tamsulosin HCl (FLOMAX) 0.4 MG CAPS Take 0.4 mg by mouth 2 (two) times daily.     Historical Provider, MD    ALLERGIES:  Allergies  Allergen Reactions  . Morphine And Related     welts  .  Penicillins Hives    SOCIAL HISTORY:  History  Substance Use Topics  . Smoking status: Current Every Day Smoker    Types: Cigarettes  . Smokeless tobacco: Not on file  . Alcohol Use: Yes     Comment: occ    FAMILY HISTORY: No family history on file.  EXAM: BP 112/81  Pulse 80  Temp(Src) 97.4 F (36.3 C) (Oral)  Resp 18  SpO2 100% CONSTITUTIONAL: Alert and oriented and responds appropriately to questions. Well-appearing; well-nourished Obese.  HEAD: Normocephalic EYES: Conjunctivae clear, PERRL ENT: normal nose; no rhinorrhea; moist  mucous membranes; pharynx without lesions noted NECK: Supple, no meningismus, no LAD  CARD: RRR; S1 and S2 appreciated; no murmurs, no clicks, no rubs, no gallops RESP: Normal chest excursion without splinting or tachypnea; breath sounds clear and equal bilaterally; no wheezes, no rhonchi, no rales,  ABD/GI: Normal bowel sounds; non-distended; soft, non-tender, no rebound, no guarding BACK:  The back appears normal and is non-tender to palpation, there is no CVA tenderness EXT: Normal ROM in all joints; non-tender to palpation; no edema; normal capillary refill; no cyanosis    SKIN: Normal color for age and race; warm NEURO: Moves all extremities equally PSYCH: The patient's mood and manner are appropriate. Grooming and personal hygiene are appropriate.  MEDICAL DECISION MAKING: Patient here with diarrhea after taking 2 Dulcolax tablets 2 days ago. He states he is having greater than 10 episodes of diarrhea a day. He denies any abdominal pain. His exam is completely benign. His vital signs are within normal limits. Given his age and comorbidities and frequency of diarrhea, will check basic labs to evaluate for electrolyte abnormality.  If pt is able to have BM in ED, will send stool culture and c diff.  Will give IVF and Lomotil.  ED PROGRESS:   6:34 PM- Discussed treatment plan with patient, which includes a stool sample and lab work, and the patient agreed to the plan.    8:22 PM  - Patient's labs are reassuring. He does have elevation of his LFTs but no right upper quadrant pain on exam and a normal total bilirubin. His creatinine is elevated but this is his baseline. He has received IV fluids. Urine shows no sign of infection, no ketones. Patient reports he feels better after Lomotil. His C. difficile is negative. Feel his diarrhea is likely secondary to taking 2 Dulcolax tablets 2 days ago. Will have him continue Lomotil as needed and drink plenty of fluids. Have given return precautions.  Patient and wife at bedside verbalize understanding and are comfortable with this plan.   I personally performed the services described in this documentation, which was scribed in my presence. The recorded information has been reviewed and is accurate.      [place], DO 05/28/13 2023

## 2013-05-28 NOTE — ED Notes (Signed)
Patient with no complaints at this time. Respirations even and unlabored. Skin warm/dry. Discharge instructions reviewed with patient at this time. Patient given opportunity to voice concerns/ask questions. IV removed per policy and band-aid applied to site. Patient discharged at this time and left Emergency Department with steady gait.  

## 2013-05-28 NOTE — Discharge Instructions (Signed)
Diarrhea Diarrhea is frequent loose and watery bowel movements. It can cause you to feel weak and dehydrated. Dehydration can cause you to become tired and thirsty, have a dry mouth, and have decreased urination that often is dark yellow. Diarrhea is a sign of another problem, most often an infection that will not last long. In most cases, diarrhea typically lasts 2 3 days. However, it can last longer if it is a sign of something more serious. It is important to treat your diarrhea as directed by your caregive to lessen or prevent future episodes of diarrhea. CAUSES  Some common causes include:  Gastrointestinal infections caused by viruses, bacteria, or parasites.  Food poisoning or food allergies.  Certain medicines, such as antibiotics, chemotherapy, and laxatives.  Artificial sweeteners and fructose.  Digestive disorders. HOME CARE INSTRUCTIONS  Ensure adequate fluid intake (hydration): have 1 cup (8 oz) of fluid for each diarrhea episode. Avoid fluids that contain simple sugars or sports drinks, fruit juices, whole milk products, and sodas. Your urine should be clear or pale yellow if you are drinking enough fluids. Hydrate with an oral rehydration solution that you can purchase at pharmacies, retail stores, and online. You can prepare an oral rehydration solution at home by mixing the following ingredients together:    tsp table salt.   tsp baking soda.   tsp salt substitute containing potassium chloride.  1  tablespoons sugar.  1 L (34 oz) of water.  Certain foods and beverages may increase the speed at which food moves through the gastrointestinal (GI) tract. These foods and beverages should be avoided and include:  Caffeinated and alcoholic beverages.  High-fiber foods, such as raw fruits and vegetables, nuts, seeds, and whole grain breads and cereals.  Foods and beverages sweetened with sugar alcohols, such as xylitol, sorbitol, and mannitol.  Some foods may be well  tolerated and may help thicken stool including:  Starchy foods, such as rice, toast, pasta, low-sugar cereal, oatmeal, grits, baked potatoes, crackers, and bagels.  Bananas.  Applesauce.  Add probiotic-rich foods to help increase healthy bacteria in the GI tract, such as yogurt and fermented milk products.  Wash your hands well after each diarrhea episode.  Only take over-the-counter or prescription medicines as directed by your caregiver.  Take a warm bath to relieve any burning or pain from frequent diarrhea episodes. SEEK IMMEDIATE MEDICAL CARE IF:   You are unable to keep fluids down.  You have persistent vomiting.  You have blood in your stool, or your stools are black and tarry.  You do not urinate in 6 8 hours, or there is only a small amount of very dark urine.  You have abdominal pain that increases or localizes.  You have weakness, dizziness, confusion, or lightheadedness.  You have a severe headache.  Your diarrhea gets worse or does not get better.  You have a fever or persistent symptoms for more than 2 3 days.  You have a fever and your symptoms suddenly get worse. MAKE SURE YOU:   Understand these instructions.  Will watch your condition.  Will get help right away if you are not doing well or get worse. Document Released: 03/21/2002 Document Revised: 03/17/2012 Document Reviewed: 12/07/2011 [employer] Patient Information 2014 [employer].  

## 2013-05-28 NOTE — ED Notes (Signed)
Diarrhea since Thursday. Denies pain.

## 2013-06-02 LAB — STOOL CULTURE

## 2013-06-30 ENCOUNTER — Emergency Department (HOSPITAL_COMMUNITY): Payer: PRIVATE HEALTH INSURANCE

## 2013-06-30 ENCOUNTER — Encounter (HOSPITAL_COMMUNITY): Payer: Self-pay | Admitting: Emergency Medicine

## 2013-06-30 ENCOUNTER — Emergency Department (HOSPITAL_COMMUNITY)
Admission: EM | Admit: 2013-06-30 | Discharge: 2013-06-30 | Disposition: A | Discharge status: Home / Self Care | Payer: PRIVATE HEALTH INSURANCE | Attending: Emergency Medicine | Admitting: Emergency Medicine

## 2013-06-30 DIAGNOSIS — Z862 Personal history of diseases of the blood and blood-forming organs and certain disorders involving the immune mechanism: Secondary | ICD-10-CM | POA: Insufficient documentation

## 2013-06-30 DIAGNOSIS — I10 Essential (primary) hypertension: Secondary | ICD-10-CM | POA: Insufficient documentation

## 2013-06-30 DIAGNOSIS — Z79899 Other long term (current) drug therapy: Secondary | ICD-10-CM | POA: Insufficient documentation

## 2013-06-30 DIAGNOSIS — Z8673 Personal history of transient ischemic attack (TIA), and cerebral infarction without residual deficits: Secondary | ICD-10-CM | POA: Insufficient documentation

## 2013-06-30 DIAGNOSIS — F17200 Nicotine dependence, unspecified, uncomplicated: Secondary | ICD-10-CM | POA: Insufficient documentation

## 2013-06-30 DIAGNOSIS — Z8639 Personal history of other endocrine, nutritional and metabolic disease: Secondary | ICD-10-CM | POA: Insufficient documentation

## 2013-06-30 DIAGNOSIS — Z7902 Long term (current) use of antithrombotics/antiplatelets: Secondary | ICD-10-CM | POA: Insufficient documentation

## 2013-06-30 DIAGNOSIS — R059 Cough, unspecified: Secondary | ICD-10-CM

## 2013-06-30 DIAGNOSIS — R0602 Shortness of breath: Secondary | ICD-10-CM | POA: Insufficient documentation

## 2013-06-30 DIAGNOSIS — J40 Bronchitis, not specified as acute or chronic: Secondary | ICD-10-CM

## 2013-06-30 DIAGNOSIS — J209 Acute bronchitis, unspecified: Secondary | ICD-10-CM | POA: Insufficient documentation

## 2013-06-30 DIAGNOSIS — R05 Cough: Secondary | ICD-10-CM

## 2013-06-30 DIAGNOSIS — Z880 Allergy status to penicillin: Secondary | ICD-10-CM | POA: Insufficient documentation

## 2013-06-30 MED ORDER — BENZONATATE 100 MG PO CAPS: 100.0000 mg | ORAL_CAPSULE | Freq: Three times a day (TID) | ORAL | Status: DC

## 2013-06-30 MED ORDER — LEVOFLOXACIN 500 MG PO TABS: 500.0000 mg | ORAL_TABLET | Freq: Every day | ORAL | Status: DC

## 2013-06-30 MED ORDER — HYDROCOD POLST-CHLORPHEN POLST 10-8 MG/5ML PO LQCR: 5.0000 mL | Freq: Two times a day (BID) | ORAL | Status: DC

## 2013-06-30 MED ORDER — HYDROCODONE-ACETAMINOPHEN 7.5-325 MG/15ML PO SOLN
10.0000 mL | Freq: Once | ORAL | Status: AC
  Administered 2013-06-30: 10 mL via ORAL
  Filled 2013-06-30: qty 15

## 2013-06-30 NOTE — ED Notes (Signed)
Pt feels like throat "closing up" checked 02 , 100%.  Sounds congested , throat  Is injected

## 2013-06-30 NOTE — ED Notes (Signed)
Cough,nonproductive, no fever  Headache.

## 2013-06-30 NOTE — ED Provider Notes (Signed)
CSN: [id number]     Arrival date & time 06/30/13  1441 History   First MD Initiated Contact with Patient 06/30/13 1833 This chart was scribed for Tanna Furry, MD by Anastasia Pall, ED Scribe. This patient was seen in room APA04/APA04 and the patient's care was started at 6:49 PM.     Chief Complaint  Patient presents with  . Cough   (Consider location/radiation/quality/duration/timing/severity/associated sxs/prior Treatment) The history is provided by the patient. No language interpreter was used.   HPI Comments: Ryan Rollins is a 73 y.o. male who presents to the Emergency Department complaining of gradually worsening, intermittent, cough, intermittently productive of clear sputum, with an associated tickle in his throat and SOB, onset 3 days ago. He states he feels like he needs to cough up sputum, but is unable to get enough up. He reports chest pain when coughing. He denies fever, extremity swelling, and any other associated symptoms. Pt has swelling in LE at baseline, takes fluid pills for relief. He reports h/o aneurism and stroke, states he is unable to take OTC cough medicines. He reports h/o HTN, but denies heart problems. He denies h/o chest surgeries, asthma, lung disease. He has an allergy to Penicillins.    PCP - Glenda Chroman., MD  Past Medical History  Diagnosis Date  . Cerebral aneurysm   . Stroke   . Hypertension   . Hypercholesterolemia    Past Surgical History  Procedure Laterality Date  . Cholecystectomy    . Ankle surgery    . Prostate surgery  biopsy     History reviewed. No pertinent family history. History  Substance Use Topics  . Smoking status: Current Every Day Smoker    Types: Cigarettes  . Smokeless tobacco: Not on file  . Alcohol Use: Yes     Comment: occ    Review of Systems  Constitutional: Negative for fever, chills, diaphoresis, appetite change and fatigue.  HENT: Negative for mouth sores, sore throat and trouble swallowing.   Eyes: Negative  for visual disturbance.  Respiratory: Positive for cough and shortness of breath. Negative for chest tightness and wheezing.   Cardiovascular: Positive for chest pain. Negative for leg swelling.  Gastrointestinal: Negative for nausea, vomiting, abdominal pain, diarrhea and abdominal distention.  Endocrine: Negative for polydipsia, polyphagia and polyuria.  Genitourinary: Negative for dysuria, frequency and hematuria.  Musculoskeletal: Negative for gait problem.  Skin: Negative for color change, pallor and rash.  Neurological: Negative for dizziness, syncope, light-headedness and headaches.  Hematological: Does not bruise/bleed easily.  Psychiatric/Behavioral: Negative for behavioral problems and confusion.    Allergies  Morphine and related and Penicillins  Home Medications   Current Outpatient Rx  Name  Route  Sig  Dispense  Refill  . albuterol (PROVENTIL HFA;VENTOLIN HFA) 108 (90 BASE) MCG/ACT inhaler   Inhalation   Inhale 2 puffs into the lungs every 6 (six) hours as needed for wheezing or shortness of breath.         . clopidogrel (PLAVIX) 75 MG tablet   Oral   Take 75 mg by mouth daily.         . diphenoxylate-atropine (LOMOTIL) 2.5-0.025 MG per tablet   Oral   Take 1 tablet by mouth 4 (four) times daily as needed for diarrhea or loose stools.   15 tablet   0   . docusate sodium (COLACE) 100 MG capsule   Oral   Take 100 mg by mouth daily as needed. constipation         .  enalapril (VASOTEC) 20 MG tablet   Oral   Take 20 mg by mouth 2 (two) times daily.          . furosemide (LASIX) 40 MG tablet   Oral   Take 20-40 mg by mouth daily as needed for fluid (take 1/2-1 tablet daily as needed.).          Marland Kitchen HYDROcodone-acetaminophen (NORCO) 10-325 MG per tablet   Oral   Take 1 tablet by mouth 3 (three) times daily as needed. pain         . levocetirizine (XYZAL) 5 MG tablet   Oral   Take 5 mg by mouth every evening.         Marland Kitchen NIFEdipine  (PROCARDIA-XL/ADALAT CC) 60 MG 24 hr tablet   Oral   Take 60 mg by mouth 2 (two) times daily.         Marland Kitchen omeprazole (PRILOSEC) 40 MG capsule   Oral   Take 40 mg by mouth every morning.         . Tamsulosin HCl (FLOMAX) 0.4 MG CAPS   Oral   Take 0.4 mg by mouth 2 (two) times daily.           BP 147/88  Pulse 99  Temp(Src) 97.7 F (36.5 C) (Oral)  Resp 20  Ht 6\' 3"  (1.905 m)  Wt 314 lb (142.429 kg)  BMI 39.25 kg/m2  SpO2 100%  Physical Exam  Constitutional: He is oriented to person, place, and time. He appears well-developed and well-nourished. No distress.  HENT:  Head: Normocephalic.  Eyes: Conjunctivae are normal. Pupils are equal, round, and reactive to light. No scleral icterus.  Neck: Normal range of motion. Neck supple. Muscular tenderness (bilateral anterior neck) present. No thyromegaly present.  Cardiovascular: Normal rate and regular rhythm.  Exam reveals no gallop and no friction rub.   No murmur heard. Pulmonary/Chest: Effort normal. No respiratory distress. He has no wheezes. He has no rales.  Globally diminished right sided breath sounds. Frequent cough.   Abdominal: Soft. Bowel sounds are normal. He exhibits no distension. There is no tenderness. There is no rebound.  Musculoskeletal: Normal range of motion.  Neurological: He is alert and oriented to person, place, and time.  Skin: Skin is warm and dry. No rash noted.  Psychiatric: He has a normal mood and affect. His behavior is normal.    ED Course  Procedures (including critical care time)  DIAGNOSTIC STUDIES: Oxygen Saturation is 100% on room air, normal by my interpretation.    COORDINATION OF CARE: 6:55 PM-Discussed treatment plan with pt at bedside and pt agreed to plan.   Dg Chest 2 View  06/30/2013   CLINICAL DATA:  Sore throat  EXAM: CHEST  2 VIEW  COMPARISON:  DG CHEST 1V PORT dated 03/13/2013  FINDINGS: There is bibasilar linear airspace disease likely reflecting atelectasis. There is  no focal parenchymal opacity, pleural effusion, or pneumothorax. The heart and mediastinal contours are unremarkable.  The osseous structures are unremarkable.  IMPRESSION: No active cardiopulmonary disease.   Electronically Signed   By: Kathreen Devoid   On: 06/30/2013 16:21    EKG Interpretation None      Medications  HYDROcodone-acetaminophen (HYCET) 7.5-325 mg/15 ml solution 10 mL (not administered)   MDM   Final diagnoses:  Cough  Bronchitis    Normal x-ray. Not hypoxemic. Not having chest pain. Plan will be cough suppressants, short course antibiotic. Not wheezing. Do not fill the prednisone or broken  dilator indicated.  I personally performed the services described in this documentation, which was scribed in my presence. The recorded information has been reviewed and is accurate.    Tanna Furry, MD 06/30/13 1901

## 2013-06-30 NOTE — Discharge Instructions (Signed)
Bronchitis Bronchitis is swelling (inflammation) of the air tubes leading to your lungs (bronchi). This causes mucus and a cough. If the swelling gets bad, you may have trouble breathing. HOME CARE   Rest.  Drink enough fluids to keep your pee (urine) clear or pale yellow (unless you have a condition where you have to watch how much you drink).  Only take medicine as told by your doctor. If you were given antibiotic medicines, finish them even if you start to feel better.  Avoid smoke, irritating chemicals, and strong smells. These make the problem worse. Quit smoking if you smoke. This helps your lungs heal faster.  Use a cool mist humidifier. Change the water in the humidifier every day. You can also sit in the bathroom with hot shower running for 5 10 minutes. Keep the door closed.  See your health care provider as told.  Wash your hands often. GET HELP IF: Your problems do not get better after 1 week. GET HELP RIGHT AWAY IF:   Your fever gets worse.  You have chills.  Your chest hurts.  Your problems breathing get worse.  You have blood in your mucus.  You pass out (faint).  You feel lightheaded.  You have a bad headache.  You throw up (vomit) again and again. MAKE SURE YOU:  Understand these instructions.  Will watch your condition.  Will get help right away if you are not doing well or get worse. Document Released: 09/17/2007 Document Revised: 01/19/2013 Document Reviewed: 11/23/2012 [facility] Patient Information 2014 [place], [place].  Cough, Adult  A cough is a reflex. It helps you clear your throat and airways. A cough can help heal your body. A cough can last 2 or 3 weeks (acute) or may last more than 8 weeks (chronic). Some common causes of a cough can include an infection, allergy, or a cold. HOME CARE  Only take medicine as told by your doctor.  If given, take your medicines (antibiotics) as told. Finish them even if you start to feel better.  Use a  cold steam vaporizer or humidier in your home. This can help loosen thick spit (secretions).  Sleep so you are almost sitting up (semi-upright). Use pillows to do this. This helps reduce coughing.  Rest as needed.  Stop smoking if you smoke. GET HELP RIGHT AWAY IF:  You have yellowish-white fluid (pus) in your thick spit.  Your cough gets worse.  Your medicine does not reduce coughing, and you are losing sleep.  You cough up blood.  You have trouble breathing.  Your pain gets worse and medicine does not help.  You have a fever. MAKE SURE YOU:   Understand these instructions.  Will watch your condition.  Will get help right away if you are not doing well or get worse. Document Released: 12/12/2010 Document Revised: 06/23/2011 Document Reviewed: 12/12/2010 [facility] Patient Information 2014 [place].  Cough, Adult  A cough is a reflex. It helps you clear your throat and airways. A cough can help heal your body. A cough can last 2 or 3 weeks (acute) or may last more than 8 weeks (chronic). Some common causes of a cough can include an infection, allergy, or a cold. HOME CARE  Only take medicine as told by your doctor.  If given, take your medicines (antibiotics) as told. Finish them even if you start to feel better.  Use a cold steam vaporizer or humidier in your home. This can help loosen thick spit (secretions).  Sleep  so you are almost sitting up (semi-upright). Use pillows to do this. This helps reduce coughing.  Rest as needed.  Stop smoking if you smoke. GET HELP RIGHT AWAY IF:  You have yellowish-white fluid (pus) in your thick spit.  Your cough gets worse.  Your medicine does not reduce coughing, and you are losing sleep.  You cough up blood.  You have trouble breathing.  Your pain gets worse and medicine does not help.  You have a fever. MAKE SURE YOU:   Understand these instructions.  Will watch your condition.  Will get help right away  if you are not doing well or get worse. Document Released: 12/12/2010 Document Revised: 06/23/2011 Document Reviewed: 12/12/2010 [facility] Patient Information 2014 [place].

## 2013-07-05 ENCOUNTER — Ambulatory Visit (INDEPENDENT_AMBULATORY_CARE_PROVIDER_SITE_OTHER): Payer: PRIVATE HEALTH INSURANCE | Admitting: Urology

## 2013-07-05 DIAGNOSIS — C61 Malignant neoplasm of prostate: Secondary | ICD-10-CM

## 2013-07-05 DIAGNOSIS — N529 Male erectile dysfunction, unspecified: Secondary | ICD-10-CM

## 2013-11-08 ENCOUNTER — Ambulatory Visit: Payer: PRIVATE HEALTH INSURANCE | Admitting: Urology

## 2013-12-13 ENCOUNTER — Ambulatory Visit (INDEPENDENT_AMBULATORY_CARE_PROVIDER_SITE_OTHER): Payer: PRIVATE HEALTH INSURANCE | Admitting: Urology

## 2013-12-13 DIAGNOSIS — C61 Malignant neoplasm of prostate: Secondary | ICD-10-CM

## 2014-01-31 ENCOUNTER — Ambulatory Visit: Payer: PRIVATE HEALTH INSURANCE | Admitting: Urology

## 2014-03-12 ENCOUNTER — Emergency Department (HOSPITAL_COMMUNITY)
Admission: EM | Admit: 2014-03-12 | Discharge: 2014-03-12 | Disposition: A | Discharge status: Home / Self Care | Payer: PRIVATE HEALTH INSURANCE | Attending: Emergency Medicine | Admitting: Emergency Medicine

## 2014-03-12 ENCOUNTER — Encounter (HOSPITAL_COMMUNITY): Payer: Self-pay | Admitting: Emergency Medicine

## 2014-03-12 DIAGNOSIS — I10 Essential (primary) hypertension: Secondary | ICD-10-CM | POA: Insufficient documentation

## 2014-03-12 DIAGNOSIS — Z7901 Long term (current) use of anticoagulants: Secondary | ICD-10-CM | POA: Diagnosis not present

## 2014-03-12 DIAGNOSIS — Z720 Tobacco use: Secondary | ICD-10-CM | POA: Diagnosis not present

## 2014-03-12 DIAGNOSIS — Z8673 Personal history of transient ischemic attack (TIA), and cerebral infarction without residual deficits: Secondary | ICD-10-CM | POA: Insufficient documentation

## 2014-03-12 DIAGNOSIS — E780 Pure hypercholesterolemia: Secondary | ICD-10-CM | POA: Diagnosis not present

## 2014-03-12 DIAGNOSIS — R0789 Other chest pain: Secondary | ICD-10-CM

## 2014-03-12 DIAGNOSIS — R109 Unspecified abdominal pain: Secondary | ICD-10-CM | POA: Diagnosis present

## 2014-03-12 DIAGNOSIS — Z79899 Other long term (current) drug therapy: Secondary | ICD-10-CM | POA: Insufficient documentation

## 2014-03-12 LAB — COMPREHENSIVE METABOLIC PANEL
ALBUMIN: 4.1 g/dL (ref 3.5–5.2)
ALK PHOS: 123 U/L — AB (ref 39–117)
ALT: 17 U/L (ref 0–53)
AST: 20 U/L (ref 0–37)
Anion gap: 14 (ref 5–15)
BUN: 31 mg/dL — ABNORMAL HIGH (ref 6–23)
CO2: 24 mEq/L (ref 19–32)
Calcium: 9.9 mg/dL (ref 8.4–10.5)
Chloride: 101 mEq/L (ref 96–112)
Creatinine, Ser: 2.75 mg/dL — ABNORMAL HIGH (ref 0.50–1.35)
GFR calc Af Amer: 25 mL/min — ABNORMAL LOW (ref >90)
GFR calc non Af Amer: 22 mL/min — ABNORMAL LOW (ref >90)
Glucose, Bld: 117 mg/dL — ABNORMAL HIGH (ref 70–99)
POTASSIUM: 3.9 meq/L (ref 3.7–5.3)
Sodium: 139 mEq/L (ref 137–147)
TOTAL PROTEIN: 7.5 g/dL (ref 6.0–8.3)
Total Bilirubin: 0.3 mg/dL (ref 0.3–1.2)

## 2014-03-12 LAB — CBC WITH DIFFERENTIAL/PLATELET
BASOS PCT: 0 % (ref 0–1)
Basophils Absolute: 0 10*3/uL (ref 0.0–0.1)
EOS ABS: 0.3 10*3/uL (ref 0.0–0.7)
Eosinophils Relative: 5 % (ref 0–5)
HCT: 37.4 % — ABNORMAL LOW (ref 39.0–52.0)
Hemoglobin: 12.4 g/dL — ABNORMAL LOW (ref 13.0–17.0)
Lymphocytes Relative: 20 % (ref 12–46)
Lymphs Abs: 1.2 10*3/uL (ref 0.7–4.0)
MCH: 29.1 pg (ref 26.0–34.0)
MCHC: 33.2 g/dL (ref 30.0–36.0)
MCV: 87.8 fL (ref 78.0–100.0)
Monocytes Absolute: 0.5 10*3/uL (ref 0.1–1.0)
Monocytes Relative: 8 % (ref 3–12)
Neutro Abs: 3.9 10*3/uL (ref 1.7–7.7)
Neutrophils Relative %: 67 % (ref 43–77)
Platelets: 302 10*3/uL (ref 150–400)
RBC: 4.26 MIL/uL (ref 4.22–5.81)
RDW: 15 % (ref 11.5–15.5)
WBC: 5.9 10*3/uL (ref 4.0–10.5)

## 2014-03-12 LAB — LIPASE, BLOOD: Lipase: 22 U/L (ref 11–59)

## 2014-03-12 LAB — TROPONIN I: Troponin I: <0.3 ng/mL (ref <0.30)

## 2014-03-12 MED ORDER — GI COCKTAIL ~~LOC~~
30.0000 mL | Freq: Once | ORAL | Status: AC
  Administered 2014-03-12: 30 mL via ORAL
  Filled 2014-03-12: qty 30

## 2014-03-12 NOTE — Discharge Instructions (Signed)
Take Maalox 2 tablespoons after meals and at bedtime as needed for discomfort. You can also take Gas-X as directed for gassy feeling in your chest. Get your blood pressure  rechecked by your primary care physician within the next 3 weeks. Today's was mildly elevated at 146/90

## 2014-03-12 NOTE — ED Provider Notes (Signed)
CSN: [id number]     Arrival date & time 03/12/14  0701 History  This chart was scribed for Ryan Dakin, MD by [facility], ED Scribe. The patient was seen in [place] and the patient's care was started at 7:17 AM.   Chief Complaint  Patient presents with  . Abdominal Pain   HPI  HPI Comments: EMIR NACK is a 73 y.o. male who presents to the Emergency Department complaining of CP which began yesterday around 2 PM. The pain is described as constant and gas. Pt states he cannot belch.Pt has taken Protonix which has not provided much relief.. Pt states it was the first time he has ever had it. His last normal BM was two days ago. He has eaten shredded wheat, cheese, and aspam sandwich. No fever no shortness of breath no nausea or vomiting no other associated symptoms pain not made better or worse by anything Pt does smoke and drink. He did not drink yesterday. His PCP is Dr. Blenda Bridegroom in [place]. He is allergic to penicllin and morphine. Pt has had a stroke in the past  He denies fever, cough  Past Medical History  Diagnosis Date  . Cerebral aneurysm   . Stroke   . Hypertension   . Hypercholesterolemia    Past Surgical History  Procedure Laterality Date  . Cholecystectomy    . Ankle surgery    . Prostate surgery  biopsy     No family history on file. History  Substance Use Topics  . Smoking status: Current Every Day Smoker    Types: Cigarettes  . Smokeless tobacco: Not on file  . Alcohol Use: Yes     Comment: occ    Review of Systems  Constitutional: Negative.  Negative for fever.  HENT: Negative.   Cardiovascular: Positive for chest pain.       Syncope  Gastrointestinal: Negative.   Musculoskeletal: Negative.   Skin: Negative.   Allergic/Immunologic: Negative.   Neurological: Negative.   Psychiatric/Behavioral: Negative.   All other systems reviewed and are negative.     Allergies  Morphine and related and Penicillins  Home Medications   Prior to Admission  medications   Medication Sig Start Date End Date Taking? Authorizing Provider  albuterol (PROVENTIL HFA;VENTOLIN HFA) 108 (90 BASE) MCG/ACT inhaler Inhale 2 puffs into the lungs every 6 (six) hours as needed for wheezing or shortness of breath.    Historical Provider, MD  benzonatate (TESSALON) 100 MG capsule Take 1 capsule (100 mg total) by mouth every 8 (eight) hours. 06/30/13   Tanna Furry, MD  chlorpheniramine-HYDROcodone [facility] PENNKINETIC ER) 10-8 MG/5ML LQCR Take 5 mLs by mouth every 12 (twelve) hours. 06/30/13   Tanna Furry, MD  clopidogrel (PLAVIX) 75 MG tablet Take 75 mg by mouth daily.    Historical Provider, MD  diphenoxylate-atropine (LOMOTIL) 2.5-0.025 MG per tablet Take 1 tablet by mouth 4 (four) times daily as needed for diarrhea or loose stools. 05/28/13   Kristen N Ward, DO  docusate sodium (COLACE) 100 MG capsule Take 100 mg by mouth daily as needed. constipation    Historical Provider, MD  enalapril (VASOTEC) 20 MG tablet Take 20 mg by mouth 2 (two) times daily.     Historical Provider, MD  furosemide (LASIX) 40 MG tablet Take 20-40 mg by mouth daily as needed for fluid (take 1/2-1 tablet daily as needed.).     Historical Provider, MD  HYDROcodone-acetaminophen (NORCO) 10-325 MG per tablet Take 1 tablet by mouth 3 (three)  times daily as needed. pain    Historical Provider, MD  levocetirizine (XYZAL) 5 MG tablet Take 5 mg by mouth every evening.    Historical Provider, MD  levofloxacin (LEVAQUIN) 500 MG tablet Take 1 tablet (500 mg total) by mouth daily. 06/30/13   Tanna Furry, MD  NIFEdipine (PROCARDIA-XL/ADALAT CC) 60 MG 24 hr tablet Take 60 mg by mouth 2 (two) times daily.    Historical Provider, MD  omeprazole (PRILOSEC) 40 MG capsule Take 40 mg by mouth every morning.    Historical Provider, MD  Tamsulosin HCl (FLOMAX) 0.4 MG CAPS Take 0.4 mg by mouth 2 (two) times daily.     Historical Provider, MD   There were no vitals taken for this visit. Physical Exam  Constitutional: He  appears well-developed and well-nourished.  HENT:  Head: Normocephalic and atraumatic.  Eyes: Conjunctivae are normal. Pupils are equal, round, and reactive to light.  Neck: Neck supple. No tracheal deviation present. No thyromegaly present.  Cardiovascular: Normal rate and regular rhythm.   No murmur heard. Pulmonary/Chest: Effort normal and breath sounds normal.  Abdominal: Soft. Bowel sounds are normal. He exhibits no distension. There is no tenderness.  Obese, right upper quadrant surgical scar  Musculoskeletal: Normal range of motion. He exhibits no edema or tenderness.  Neurological: He is alert. No cranial nerve deficit. Coordination normal.  Gait normal  Skin: Skin is warm and dry. No rash noted.  Psychiatric: He has a normal mood and affect.  Nursing note and vitals reviewed.   ED Course  Procedures  DIAGNOSTIC STUDIES: Oxygen Saturation is 100% on room air, normal by my interpretation.    COORDINATION OF CARE: 7:24 AM Discussed treatment plan with pt at bedside and pt agreed to plan.  Labs Review Labs Reviewed - No data to display  Imaging Review No results found.   EKG Interpretation   Date/Time:  Sunday March 12 2014 07:18:03 EST Ventricular Rate:  69 PR Interval:  163 QRS Duration: 85 QT Interval:  384 QTC Calculation: 411 R Axis:   13 Text Interpretation:  Sinus rhythm Nonspecific T wave abnormality No  significant change since last tracing Confirmed by Winfred Leeds  MD, Charika Mikelson  [phone]) on 03/12/2014 7:27:37 AM     8:10 AM feels much improved, asymptomatic after treatment with GI cocktail Results for orders placed or performed during the hospital encounter of 03/12/14  Troponin I  Result Value Ref Range   Troponin I <0.30 <0.30 ng/mL  CBC with Differential  Result Value Ref Range   WBC 5.9 4.0 - 10.5 K/uL   RBC 4.26 4.22 - 5.81 MIL/uL   Hemoglobin 12.4 (L) 13.0 - 17.0 g/dL   HCT 37.4 (L) 39.0 - 52.0 %   MCV 87.8 78.0 - 100.0 fL   MCH 29.1 26.0 -  34.0 pg   MCHC 33.2 30.0 - 36.0 g/dL   RDW 15.0 11.5 - 15.5 %   Platelets 302 150 - 400 K/uL   Neutrophils Relative % 67 43 - 77 %   Neutro Abs 3.9 1.7 - 7.7 K/uL   Lymphocytes Relative 20 12 - 46 %   Lymphs Abs 1.2 0.7 - 4.0 K/uL   Monocytes Relative 8 3 - 12 %   Monocytes Absolute 0.5 0.1 - 1.0 K/uL   Eosinophils Relative 5 0 - 5 %   Eosinophils Absolute 0.3 0.0 - 0.7 K/uL   Basophils Relative 0 0 - 1 %   Basophils Absolute 0.0 0.0 - 0.1 K/uL  Comprehensive  metabolic panel  Result Value Ref Range   Sodium 139 137 - 147 mEq/L   Potassium 3.9 3.7 - 5.3 mEq/L   Chloride 101 96 - 112 mEq/L   CO2 24 19 - 32 mEq/L   Glucose, Bld 117 (H) 70 - 99 mg/dL   BUN 31 (H) 6 - 23 mg/dL   Creatinine, Ser 2.75 (H) 0.50 - 1.35 mg/dL   Calcium 9.9 8.4 - 10.5 mg/dL   Total Protein 7.5 6.0 - 8.3 g/dL   Albumin 4.1 3.5 - 5.2 g/dL   AST 20 0 - 37 U/L   ALT 17 0 - 53 U/L   Alkaline Phosphatase 123 (H) 39 - 117 U/L   Total Bilirubin 0.3 0.3 - 1.2 mg/dL   GFR calc non Af Amer 22 (L) >90 mL/min   GFR calc Af Amer 25 (L) >90 mL/min   Anion gap 14 5 - 15  Lipase, blood  Result Value Ref Range   Lipase 22 11 - 59 U/L   No results found.  MDM  Renal insufficiency is chronic. Strongly doubt acute coronary syndrome with nonacute EKG negative troponin and atypical symptoms after greater than 18 hours of symptoms. Plan Maalox 2 tablespoons after meals and at bedtime as needed for discomfort. Patient can also take Gas-X as directed. Blood pressure recheck 3 weeks Diagnosis#1 atypical chest pain #2 chronic renal insufficiency #3 hypertension Final diagnoses:  None        Ryan Dakin, MD 03/12/14 3716

## 2014-03-12 NOTE — ED Notes (Signed)
Patient c/o epigastric and mid chest pressure x 2 days. States it feels like he "has to belch but nothing comes out". C/o bloating. LBM 2 days ago. Denies vomiting, denies nausea.

## 2014-03-26 ENCOUNTER — Emergency Department (HOSPITAL_COMMUNITY)
Admission: EM | Admit: 2014-03-26 | Discharge: 2014-03-26 | Disposition: A | Discharge status: Home / Self Care | Payer: PRIVATE HEALTH INSURANCE | Attending: Emergency Medicine | Admitting: Emergency Medicine

## 2014-03-26 ENCOUNTER — Encounter (HOSPITAL_COMMUNITY): Payer: Self-pay | Admitting: *Deleted

## 2014-03-26 ENCOUNTER — Emergency Department (HOSPITAL_COMMUNITY): Payer: PRIVATE HEALTH INSURANCE

## 2014-03-26 DIAGNOSIS — Z720 Tobacco use: Secondary | ICD-10-CM | POA: Diagnosis not present

## 2014-03-26 DIAGNOSIS — Z9089 Acquired absence of other organs: Secondary | ICD-10-CM | POA: Insufficient documentation

## 2014-03-26 DIAGNOSIS — Z79899 Other long term (current) drug therapy: Secondary | ICD-10-CM | POA: Diagnosis not present

## 2014-03-26 DIAGNOSIS — I10 Essential (primary) hypertension: Secondary | ICD-10-CM | POA: Diagnosis not present

## 2014-03-26 DIAGNOSIS — K219 Gastro-esophageal reflux disease without esophagitis: Secondary | ICD-10-CM | POA: Diagnosis not present

## 2014-03-26 DIAGNOSIS — Z8639 Personal history of other endocrine, nutritional and metabolic disease: Secondary | ICD-10-CM | POA: Insufficient documentation

## 2014-03-26 DIAGNOSIS — Z792 Long term (current) use of antibiotics: Secondary | ICD-10-CM | POA: Insufficient documentation

## 2014-03-26 DIAGNOSIS — R3919 Other difficulties with micturition: Secondary | ICD-10-CM | POA: Insufficient documentation

## 2014-03-26 DIAGNOSIS — Z7902 Long term (current) use of antithrombotics/antiplatelets: Secondary | ICD-10-CM | POA: Diagnosis not present

## 2014-03-26 DIAGNOSIS — Z880 Allergy status to penicillin: Secondary | ICD-10-CM | POA: Diagnosis not present

## 2014-03-26 DIAGNOSIS — R109 Unspecified abdominal pain: Secondary | ICD-10-CM

## 2014-03-26 DIAGNOSIS — Z8673 Personal history of transient ischemic attack (TIA), and cerebral infarction without residual deficits: Secondary | ICD-10-CM | POA: Insufficient documentation

## 2014-03-26 LAB — COMPREHENSIVE METABOLIC PANEL
ALK PHOS: 117 U/L (ref 39–117)
ALT: 16 U/L (ref 0–53)
AST: 23 U/L (ref 0–37)
Albumin: 3.8 g/dL (ref 3.5–5.2)
Anion gap: 13 (ref 5–15)
BILIRUBIN TOTAL: 0.4 mg/dL (ref 0.3–1.2)
BUN: 20 mg/dL (ref 6–23)
CO2: 25 mEq/L (ref 19–32)
Calcium: 9.5 mg/dL (ref 8.4–10.5)
Chloride: 98 mEq/L (ref 96–112)
Creatinine, Ser: 2.66 mg/dL — ABNORMAL HIGH (ref 0.50–1.35)
GFR calc non Af Amer: 23 mL/min — ABNORMAL LOW (ref >90)
GFR, EST AFRICAN AMERICAN: 26 mL/min — AB (ref >90)
GLUCOSE: 103 mg/dL — AB (ref 70–99)
POTASSIUM: 3.7 meq/L (ref 3.7–5.3)
Sodium: 136 mEq/L — ABNORMAL LOW (ref 137–147)
Total Protein: 6.9 g/dL (ref 6.0–8.3)

## 2014-03-26 LAB — CBC WITH DIFFERENTIAL/PLATELET
Basophils Absolute: 0 10*3/uL (ref 0.0–0.1)
Basophils Relative: 0 % (ref 0–1)
EOS ABS: 0 10*3/uL (ref 0.0–0.7)
Eosinophils Relative: 0 % (ref 0–5)
HCT: 42.4 % (ref 39.0–52.0)
HEMOGLOBIN: 13.8 g/dL (ref 13.0–17.0)
LYMPHS ABS: 0.5 10*3/uL — AB (ref 0.7–4.0)
Lymphocytes Relative: 5 % — ABNORMAL LOW (ref 12–46)
MCH: 32.3 pg (ref 26.0–34.0)
MCHC: 32.5 g/dL (ref 30.0–36.0)
MCV: 99.3 fL (ref 78.0–100.0)
MONOS PCT: 10 % (ref 3–12)
Monocytes Absolute: 1 10*3/uL (ref 0.1–1.0)
Neutro Abs: 7.8 10*3/uL — ABNORMAL HIGH (ref 1.7–7.7)
Neutrophils Relative %: 84 % — ABNORMAL HIGH (ref 43–77)
Platelets: 222 10*3/uL (ref 150–400)
RBC: 4.27 MIL/uL (ref 4.22–5.81)
RDW: 13.2 % (ref 11.5–15.5)
WBC: 9.2 10*3/uL (ref 4.0–10.5)

## 2014-03-26 LAB — TROPONIN I: Troponin I: <0.3 ng/mL (ref <0.30)

## 2014-03-26 LAB — LIPASE, BLOOD: Lipase: 13 U/L (ref 11–59)

## 2014-03-26 MED ORDER — SUCRALFATE 1 G PO TABS
ORAL_TABLET | ORAL | Status: AC
  Filled 2014-03-26: qty 1

## 2014-03-26 MED ORDER — SIMETHICONE 40 MG/0.6ML PO SUSP
40.0000 mg | Freq: Once | ORAL | Status: AC
  Administered 2014-03-26: 40 mg via ORAL
  Filled 2014-03-26: qty 30

## 2014-03-26 MED ORDER — FAMOTIDINE 20 MG PO TABS
40.0000 mg | ORAL_TABLET | Freq: Once | ORAL | Status: AC
  Administered 2014-03-26: 40 mg via ORAL
  Filled 2014-03-26: qty 2

## 2014-03-26 MED ORDER — SUCRALFATE 1 G PO TABS
1.0000 g | ORAL_TABLET | Freq: Three times a day (TID) | ORAL | Status: DC
  Administered 2014-03-26: 1 g via ORAL
  Filled 2014-03-26 (×7): qty 1

## 2014-03-26 MED ORDER — GI COCKTAIL ~~LOC~~
30.0000 mL | Freq: Once | ORAL | Status: AC
  Administered 2014-03-26: 30 mL via ORAL
  Filled 2014-03-26: qty 30

## 2014-03-26 NOTE — ED Notes (Addendum)
Pt states he feels like he has indigestion and that he has been trying to burn all day but can't. Pt also c/o pain to the left lower leg. Pt DENIES having chest pain. Attempted to do EKG in triage and pt states he didn't need an EKG because he is not having chest pain.

## 2014-03-26 NOTE — ED Provider Notes (Signed)
CSN: [id number]     Arrival date & time 03/26/14  2035 History  This chart was scribed for Leota Jacobsen, MD by Lowella Petties, ED Scribe. The patient was seen in room APA06/APA06. Patient's care was started at 8:57 PM.     Chief Complaint  Patient presents with  . Gastrophageal Reflux   The history is provided by the patient. No language interpreter was used.   HPI Comments: Ryan Rollins is a 73 y.o. male who presents to the Emergency Department complaining of constant, burning, central, upper, abdominal pain. He reports that his pain is exacerbated by eating. He reports associated constipation, and that his most recent bowel movement was soft and occured yesterday. He reports difficulty belching, and states that this usually relieves his symptoms. He states that he has a history of gastrophageal reflux. He denies nausea, vomiting, or diarrhea. He reports a history of cholecystectomy. He additionally repots difficulty urinating due to a chronic prostate condition. Denies any flank pain. No reported fever. No diarrhea. Denies any anginal type symptoms.  Past Medical History  Diagnosis Date  . Cerebral aneurysm   . Stroke   . Hypertension   . Hypercholesterolemia    Past Surgical History  Procedure Laterality Date  . Cholecystectomy    . Ankle surgery    . Prostate surgery  biopsy     History reviewed. No pertinent family history. History  Substance Use Topics  . Smoking status: Current Every Day Smoker    Types: Cigarettes  . Smokeless tobacco: Not on file  . Alcohol Use: Yes     Comment: occ    Review of Systems  Gastrointestinal: Positive for abdominal pain.  Genitourinary: Positive for difficulty urinating.  All other systems reviewed and are negative.  Allergies  Morphine and related and Penicillins  Home Medications   Prior to Admission medications   Medication Sig Start Date End Date Taking? Authorizing Provider  albuterol (PROVENTIL HFA;VENTOLIN HFA) 108  (90 BASE) MCG/ACT inhaler Inhale 2 puffs into the lungs every 6 (six) hours as needed for wheezing or shortness of breath.    Historical Provider, MD  benzonatate (TESSALON) 100 MG capsule Take 1 capsule (100 mg total) by mouth every 8 (eight) hours. 06/30/13   Tanna Furry, MD  chlorpheniramine-HYDROcodone [facility] PENNKINETIC ER) 10-8 MG/5ML LQCR Take 5 mLs by mouth every 12 (twelve) hours. 06/30/13   Tanna Furry, MD  clopidogrel (PLAVIX) 75 MG tablet Take 75 mg by mouth daily.    Historical Provider, MD  diphenoxylate-atropine (LOMOTIL) 2.5-0.025 MG per tablet Take 1 tablet by mouth 4 (four) times daily as needed for diarrhea or loose stools. 05/28/13   Kristen N Ward, DO  docusate sodium (COLACE) 100 MG capsule Take 100 mg by mouth daily as needed. constipation    Historical Provider, MD  enalapril (VASOTEC) 20 MG tablet Take 20 mg by mouth 2 (two) times daily.     Historical Provider, MD  furosemide (LASIX) 40 MG tablet Take 20-40 mg by mouth daily as needed for fluid (take 1/2-1 tablet daily as needed.).     Historical Provider, MD  HYDROcodone-acetaminophen (NORCO) 10-325 MG per tablet Take 1 tablet by mouth 3 (three) times daily as needed. pain    Historical Provider, MD  levocetirizine (XYZAL) 5 MG tablet Take 5 mg by mouth every evening.    Historical Provider, MD  levofloxacin (LEVAQUIN) 500 MG tablet Take 1 tablet (500 mg total) by mouth daily. 06/30/13   Tanna Furry, MD  NIFEdipine (PROCARDIA-XL/ADALAT CC) 60 MG 24 hr tablet Take 60 mg by mouth 2 (two) times daily.    Historical Provider, MD  omeprazole (PRILOSEC) 40 MG capsule Take 40 mg by mouth every morning.    Historical Provider, MD  Tamsulosin HCl (FLOMAX) 0.4 MG CAPS Take 0.4 mg by mouth 2 (two) times daily.     Historical Provider, MD   Triage Vitals: BP 135/75 mmHg  Pulse 72  Temp(Src) 97.5 F (36.4 C) (Oral)  Resp 20  Ht 6\' 3"  (1.905 m)  Wt 315 lb (142.883 kg)  BMI 39.37 kg/m2  SpO2 98% Physical Exam  Constitutional: He is  oriented to person, place, and time. He appears well-developed and well-nourished.  Non-toxic appearance. No distress.  HENT:  Head: Normocephalic and atraumatic.  Eyes: Conjunctivae, EOM and lids are normal. Pupils are equal, round, and reactive to light.  Neck: Normal range of motion. Neck supple. No tracheal deviation present. No thyroid mass present.  Cardiovascular: Normal rate, regular rhythm and normal heart sounds.  Exam reveals no gallop.   No murmur heard. Pulmonary/Chest: Effort normal and breath sounds normal. No stridor. No respiratory distress. He has no decreased breath sounds. He has no wheezes. He has no rhonchi. He has no rales.  Abdominal: Soft. Normal appearance and bowel sounds are normal. He exhibits no distension. There is no tenderness. There is no rebound and no CVA tenderness.  Musculoskeletal: Normal range of motion. He exhibits no edema or tenderness.  Neurological: He is alert and oriented to person, place, and time. He has normal strength. No cranial nerve deficit or sensory deficit. GCS eye subscore is 4. GCS verbal subscore is 5. GCS motor subscore is 6.  Skin: Skin is warm and dry. No abrasion and no rash noted.  Psychiatric: He has a normal mood and affect. His speech is normal and behavior is normal.  Nursing note and vitals reviewed.   ED Course  Procedures (including critical care time) DIAGNOSTIC STUDIES: Oxygen Saturation is 98% on room air, normal by my interpretation.    COORDINATION OF CARE: 9:01 PM-Discussed treatment plan which includes lab work, and a gi cocktail with pt at bedside and pt agreed to plan.   Labs Review Labs Reviewed  CBC WITH DIFFERENTIAL  COMPREHENSIVE METABOLIC PANEL  LIPASE, BLOOD  TROPONIN I    Imaging Review No results found.   EKG Interpretation None      MDM   Final diagnoses:  None    I personally performed the services described in this documentation, which was scribed in my presence. The recorded  information has been reviewed and is accurate.  Patient refused EKG but he has no symptoms of angina at this time.   11:15 PM Patient given medications for acid reflux and now feels better. No concern for ACS. Patient already on Protonix twice a day. Was encouraged to follow-up with his doctor tomorrow. No signs of bowel obstruction at this time  Leota Jacobsen, MD 03/26/14 2316

## 2014-03-26 NOTE — Discharge Instructions (Signed)
Follow-up with your Dr. tomorrow Abdominal Pain Many things can cause abdominal pain. Usually, abdominal pain is not caused by a disease and will improve without treatment. It can often be observed and treated at home. Your health care provider will do a physical exam and possibly order blood tests and X-rays to help determine the seriousness of your pain. However, in many cases, more time must pass before a clear cause of the pain can be found. Before that point, your health care provider may not know if you need more testing or further treatment. HOME CARE INSTRUCTIONS  Monitor your abdominal pain for any changes. The following actions may help to alleviate any discomfort you are experiencing:  Only take over-the-counter or prescription medicines as directed by your health care provider.  Do not take laxatives unless directed to do so by your health care provider.  Try a clear liquid diet (broth, tea, or water) as directed by your health care provider. Slowly move to a bland diet as tolerated. SEEK MEDICAL CARE IF:  You have unexplained abdominal pain.  You have abdominal pain associated with nausea or diarrhea.  You have pain when you urinate or have a bowel movement.  You experience abdominal pain that wakes you in the night.  You have abdominal pain that is worsened or improved by eating food.  You have abdominal pain that is worsened with eating fatty foods.  You have a fever. SEEK IMMEDIATE MEDICAL CARE IF:   Your pain does not go away within 2 hours.  You keep throwing up (vomiting).  Your pain is felt only in portions of the abdomen, such as the right side or the left lower portion of the abdomen.  You pass bloody or black tarry stools. MAKE SURE YOU:  Understand these instructions.   Will watch your condition.   Will get help right away if you are not doing well or get worse.  Document Released: 01/08/2005 Document Revised: 04/05/2013 Document Reviewed:  12/08/2012 [facility] Patient Information 2015 [place], [place]. This information is not intended to replace advice given to you by your health care provider. Make sure you discuss any questions you have with your health care provider.

## 2014-03-27 ENCOUNTER — Encounter (HOSPITAL_COMMUNITY): Payer: Self-pay | Admitting: Emergency Medicine

## 2014-03-27 ENCOUNTER — Emergency Department (HOSPITAL_COMMUNITY)
Admission: EM | Admit: 2014-03-27 | Discharge: 2014-03-27 | Disposition: A | Discharge status: Home / Self Care | Payer: PRIVATE HEALTH INSURANCE | Attending: Emergency Medicine | Admitting: Emergency Medicine

## 2014-03-27 ENCOUNTER — Emergency Department (HOSPITAL_COMMUNITY): Payer: PRIVATE HEALTH INSURANCE

## 2014-03-27 DIAGNOSIS — Z792 Long term (current) use of antibiotics: Secondary | ICD-10-CM | POA: Insufficient documentation

## 2014-03-27 DIAGNOSIS — Z720 Tobacco use: Secondary | ICD-10-CM | POA: Insufficient documentation

## 2014-03-27 DIAGNOSIS — K219 Gastro-esophageal reflux disease without esophagitis: Secondary | ICD-10-CM | POA: Insufficient documentation

## 2014-03-27 DIAGNOSIS — E785 Hyperlipidemia, unspecified: Secondary | ICD-10-CM | POA: Diagnosis not present

## 2014-03-27 DIAGNOSIS — I10 Essential (primary) hypertension: Secondary | ICD-10-CM | POA: Insufficient documentation

## 2014-03-27 DIAGNOSIS — Z7902 Long term (current) use of antithrombotics/antiplatelets: Secondary | ICD-10-CM | POA: Diagnosis not present

## 2014-03-27 DIAGNOSIS — Z880 Allergy status to penicillin: Secondary | ICD-10-CM | POA: Diagnosis not present

## 2014-03-27 DIAGNOSIS — R109 Unspecified abdominal pain: Secondary | ICD-10-CM | POA: Diagnosis present

## 2014-03-27 DIAGNOSIS — Z79899 Other long term (current) drug therapy: Secondary | ICD-10-CM | POA: Diagnosis not present

## 2014-03-27 DIAGNOSIS — Z8673 Personal history of transient ischemic attack (TIA), and cerebral infarction without residual deficits: Secondary | ICD-10-CM | POA: Diagnosis not present

## 2014-03-27 DIAGNOSIS — R52 Pain, unspecified: Secondary | ICD-10-CM

## 2014-03-27 MED ORDER — GI COCKTAIL ~~LOC~~
30.0000 mL | Freq: Once | ORAL | Status: AC
  Administered 2014-03-27: 30 mL via ORAL
  Filled 2014-03-27: qty 30

## 2014-03-27 MED ORDER — OXYCODONE-ACETAMINOPHEN 5-325 MG PO TABS
1.0000 | ORAL_TABLET | Freq: Four times a day (QID) | ORAL | Status: DC | PRN
Start: 2014-03-27 — End: 2014-10-23

## 2014-03-27 MED ORDER — SUCRALFATE 1 G PO TABS
1.0000 g | ORAL_TABLET | Freq: Once | ORAL | Status: AC
  Administered 2014-03-27: 1 g via ORAL
  Filled 2014-03-27: qty 1

## 2014-03-27 MED ORDER — SUCRALFATE 1 G PO TABS
1.0000 g | ORAL_TABLET | Freq: Three times a day (TID) | ORAL | Status: DC
Start: 2014-03-27 — End: 2014-10-23

## 2014-03-27 NOTE — ED Notes (Signed)
Pt states he feels like he can't get his breath. Sat's 98 % on room air

## 2014-03-27 NOTE — ED Notes (Signed)
Pt reports acid reflux that is not relieved with Protonix or other meds. Pt states he cannot pass gas. Had "small BM" yesterday. Pt states he was seen at [facility] this am but has had no relief.

## 2014-03-27 NOTE — Discharge Instructions (Signed)
Follow up with your md this week as planned.  You may need referral to a stomach md

## 2014-03-29 NOTE — ED Provider Notes (Signed)
CSN: [id number]     Arrival date & time 03/27/14  1006 History   First MD Initiated Contact with Patient 03/27/14 1343     Chief Complaint  Patient presents with  . Gastrophageal Reflux     (Consider location/radiation/quality/duration/timing/severity/associated sxs/prior Treatment) Patient is a 73 y.o. male presenting with GERD. The history is provided by the patient (pt complains of epigastric abd pain).  Gastrophageal Reflux This is a recurrent problem. The current episode started more than 2 days ago. The problem occurs constantly. The problem has not changed since onset.Associated symptoms include abdominal pain. Pertinent negatives include no chest pain, no headaches and no shortness of breath. Nothing aggravates the symptoms. Nothing relieves the symptoms.    Past Medical History  Diagnosis Date  . Cerebral aneurysm   . Stroke   . Hypertension   . Hypercholesterolemia    Past Surgical History  Procedure Laterality Date  . Cholecystectomy    . Ankle surgery    . Prostate surgery  biopsy     Family History  Problem Relation Age of Onset  . Hypertension Mother   . Diabetes Mother   . Hypertension Father    History  Substance Use Topics  . Smoking status: Current Every Day Smoker    Types: Cigarettes  . Smokeless tobacco: Not on file  . Alcohol Use: Yes     Comment: occ    Review of Systems  Constitutional: Negative for appetite change and fatigue.  HENT: Negative for congestion, ear discharge and sinus pressure.   Eyes: Negative for discharge.  Respiratory: Negative for cough and shortness of breath.   Cardiovascular: Negative for chest pain.  Gastrointestinal: Positive for abdominal pain. Negative for diarrhea.  Genitourinary: Negative for frequency and hematuria.  Musculoskeletal: Negative for back pain.  Skin: Negative for rash.  Neurological: Negative for seizures and headaches.  Psychiatric/Behavioral: Negative for hallucinations.      Allergies   Morphine and related and Penicillins  Home Medications   Prior to Admission medications   Medication Sig Start Date End Date Taking? Authorizing Provider  albuterol (PROVENTIL HFA;VENTOLIN HFA) 108 (90 BASE) MCG/ACT inhaler Inhale 2 puffs into the lungs every 6 (six) hours as needed for wheezing or shortness of breath.   Yes Historical Provider, MD  cloNIDine (CATAPRES) 0.1 MG tablet Take 1 tablet by mouth 2 (two) times daily. 03/10/14  Yes Historical Provider, MD  clopidogrel (PLAVIX) 75 MG tablet Take 75 mg by mouth daily.   Yes Historical Provider, MD  enalapril (VASOTEC) 20 MG tablet Take 20 mg by mouth 2 (two) times daily.    Yes Historical Provider, MD  furosemide (LASIX) 40 MG tablet Take 20-40 mg by mouth daily as needed for fluid (take 1/2-1 tablet daily as needed.).    Yes Historical Provider, MD  HYDROcodone-acetaminophen (NORCO) 10-325 MG per tablet Take 1 tablet by mouth 3 (three) times daily as needed. pain   Yes Historical Provider, MD  levocetirizine (XYZAL) 5 MG tablet Take 5 mg by mouth every evening.   Yes Historical Provider, MD  NIFEdipine (PROCARDIA-XL/ADALAT CC) 60 MG 24 hr tablet Take 60 mg by mouth 2 (two) times daily.   Yes Historical Provider, MD  pantoprazole (PROTONIX) 40 MG tablet Take 40 mg by mouth 2 (two) times daily.   Yes Historical Provider, MD  Tamsulosin HCl (FLOMAX) 0.4 MG CAPS Take 0.4 mg by mouth 2 (two) times daily.    Yes Historical Provider, MD  benzonatate (TESSALON) 100 MG capsule Take 1  capsule (100 mg total) by mouth every 8 (eight) hours. Patient not taking: Reported on 03/26/2014 06/30/13   Tanna Furry, MD  chlorpheniramine-HYDROcodone [facility]) 10-8 MG/5ML [facility] Take 5 mLs by mouth every 12 (twelve) hours. Patient not taking: Reported on 03/26/2014 06/30/13   Tanna Furry, MD  diphenoxylate-atropine (LOMOTIL) 2.5-0.025 MG per tablet Take 1 tablet by mouth 4 (four) times daily as needed for diarrhea or loose stools. Patient not  taking: Reported on 03/26/2014 05/28/13   Delice Bison Ward, DO  docusate sodium (COLACE) 100 MG capsule Take 100 mg by mouth daily as needed. constipation    Historical Provider, MD  levofloxacin (LEVAQUIN) 500 MG tablet Take 1 tablet (500 mg total) by mouth daily. Patient not taking: Reported on 03/26/2014 06/30/13   Tanna Furry, MD  oxyCODONE-acetaminophen (PERCOCET/ROXICET) 5-325 MG per tablet Take 1 tablet by mouth every 6 (six) hours as needed. 03/27/14   Maudry Diego, MD  sucralfate (CARAFATE) 1 G tablet Take 1 tablet (1 g total) by mouth 4 (four) times daily -  with meals and at bedtime. 03/27/14   Maudry Diego, MD  tiZANidine (ZANAFLEX) 4 MG tablet Take 4 mg by mouth 2 (two) times daily as needed. For muscle spasm 03/10/14   Historical Provider, MD   BP 160/96 mmHg  Pulse 86  Temp(Src) 97.5 F (36.4 C) (Oral)  Resp 22  Ht 6\' 3"  (1.905 m)  Wt 315 lb (142.883 kg)  BMI 39.37 kg/m2  SpO2 100% Physical Exam  Constitutional: He is oriented to person, place, and time. He appears well-developed.  HENT:  Head: Normocephalic.  Eyes: Conjunctivae and EOM are normal. No scleral icterus.  Neck: Neck supple. No thyromegaly present.  Cardiovascular: Normal rate and regular rhythm.  Exam reveals no gallop and no friction rub.   No murmur heard. Pulmonary/Chest: No stridor. He has no wheezes. He has no rales. He exhibits no tenderness.  Abdominal: He exhibits no distension. There is tenderness. There is no rebound.  Tender epigastric  Musculoskeletal: Normal range of motion. He exhibits no edema.  Lymphadenopathy:    He has no cervical adenopathy.  Neurological: He is oriented to person, place, and time. He exhibits normal muscle tone. Coordination normal.  Skin: No rash noted. No erythema.  Psychiatric: He has a normal mood and affect. His behavior is normal.    ED Course  Procedures (including critical care time) Labs Review Labs Reviewed - No data to display  Imaging Review No  results found.   EKG Interpretation None      MDM   Final diagnoses:  Pain  Gastroesophageal reflux disease without esophagitis    Pt improved with tx.   Jerrye Bushy,   tx with carafate and follow up pcp    Maudry Diego, MD 03/29/14 [phone]

## 2014-06-13 ENCOUNTER — Ambulatory Visit: Payer: Medicaid Other | Admitting: Urology

## 2014-08-01 ENCOUNTER — Ambulatory Visit (INDEPENDENT_AMBULATORY_CARE_PROVIDER_SITE_OTHER): Payer: Medicare Other | Admitting: Urology

## 2014-08-01 DIAGNOSIS — N5201 Erectile dysfunction due to arterial insufficiency: Secondary | ICD-10-CM | POA: Diagnosis not present

## 2014-08-01 DIAGNOSIS — C61 Malignant neoplasm of prostate: Secondary | ICD-10-CM | POA: Diagnosis not present

## 2014-10-23 ENCOUNTER — Encounter (HOSPITAL_COMMUNITY): Payer: Self-pay | Admitting: *Deleted

## 2014-10-23 ENCOUNTER — Emergency Department (HOSPITAL_COMMUNITY): Payer: Medicare Other

## 2014-10-23 ENCOUNTER — Observation Stay (HOSPITAL_COMMUNITY)
Admission: EM | Admit: 2014-10-23 | Discharge: 2014-10-25 | Disposition: A | Discharge status: Home / Self Care | Payer: Medicare Other | Attending: Family Medicine | Admitting: Family Medicine

## 2014-10-23 DIAGNOSIS — I129 Hypertensive chronic kidney disease with stage 1 through stage 4 chronic kidney disease, or unspecified chronic kidney disease: Secondary | ICD-10-CM | POA: Insufficient documentation

## 2014-10-23 DIAGNOSIS — N183 Chronic kidney disease, stage 3 (moderate): Secondary | ICD-10-CM | POA: Diagnosis not present

## 2014-10-23 DIAGNOSIS — E780 Pure hypercholesterolemia: Secondary | ICD-10-CM | POA: Insufficient documentation

## 2014-10-23 DIAGNOSIS — J40 Bronchitis, not specified as acute or chronic: Secondary | ICD-10-CM | POA: Diagnosis not present

## 2014-10-23 DIAGNOSIS — R0981 Nasal congestion: Secondary | ICD-10-CM | POA: Diagnosis not present

## 2014-10-23 DIAGNOSIS — Z9049 Acquired absence of other specified parts of digestive tract: Secondary | ICD-10-CM | POA: Diagnosis not present

## 2014-10-23 DIAGNOSIS — R05 Cough: Secondary | ICD-10-CM | POA: Insufficient documentation

## 2014-10-23 DIAGNOSIS — Z720 Tobacco use: Secondary | ICD-10-CM | POA: Insufficient documentation

## 2014-10-23 DIAGNOSIS — Z79899 Other long term (current) drug therapy: Secondary | ICD-10-CM | POA: Insufficient documentation

## 2014-10-23 DIAGNOSIS — E876 Hypokalemia: Secondary | ICD-10-CM

## 2014-10-23 DIAGNOSIS — R109 Unspecified abdominal pain: Secondary | ICD-10-CM | POA: Insufficient documentation

## 2014-10-23 DIAGNOSIS — Z8673 Personal history of transient ischemic attack (TIA), and cerebral infarction without residual deficits: Secondary | ICD-10-CM | POA: Diagnosis not present

## 2014-10-23 DIAGNOSIS — Z8546 Personal history of malignant neoplasm of prostate: Secondary | ICD-10-CM | POA: Diagnosis not present

## 2014-10-23 DIAGNOSIS — Z7902 Long term (current) use of antithrombotics/antiplatelets: Secondary | ICD-10-CM | POA: Diagnosis not present

## 2014-10-23 DIAGNOSIS — R0600 Dyspnea, unspecified: Secondary | ICD-10-CM

## 2014-10-23 DIAGNOSIS — R739 Hyperglycemia, unspecified: Secondary | ICD-10-CM

## 2014-10-23 DIAGNOSIS — N189 Chronic kidney disease, unspecified: Secondary | ICD-10-CM

## 2014-10-23 DIAGNOSIS — R7989 Other specified abnormal findings of blood chemistry: Secondary | ICD-10-CM | POA: Diagnosis not present

## 2014-10-23 DIAGNOSIS — R778 Other specified abnormalities of plasma proteins: Secondary | ICD-10-CM

## 2014-10-23 DIAGNOSIS — R059 Cough, unspecified: Secondary | ICD-10-CM

## 2014-10-23 DIAGNOSIS — R0602 Shortness of breath: Secondary | ICD-10-CM | POA: Diagnosis present

## 2014-10-23 HISTORY — DX: Chronic kidney disease, stage 3 unspecified: N18.30

## 2014-10-23 HISTORY — DX: Chronic kidney disease, stage 3 (moderate): N18.3

## 2014-10-23 HISTORY — DX: Malignant neoplasm of prostate: C61

## 2014-10-23 LAB — BASIC METABOLIC PANEL
ANION GAP: 10 (ref 5–15)
BUN: 26 mg/dL — AB (ref 6–20)
CO2: 24 mmol/L (ref 22–32)
Calcium: 8.4 mg/dL — ABNORMAL LOW (ref 8.9–10.3)
Chloride: 108 mmol/L (ref 101–111)
Creatinine, Ser: 2.72 mg/dL — ABNORMAL HIGH (ref 0.61–1.24)
GFR, EST AFRICAN AMERICAN: 25 mL/min — AB (ref >60)
GFR, EST NON AFRICAN AMERICAN: 22 mL/min — AB (ref >60)
Glucose, Bld: 110 mg/dL — ABNORMAL HIGH (ref 65–99)
POTASSIUM: 3.2 mmol/L — AB (ref 3.5–5.1)
Sodium: 142 mmol/L (ref 135–145)

## 2014-10-23 LAB — CBC WITH DIFFERENTIAL/PLATELET
Basophils Absolute: 0 10*3/uL (ref 0.0–0.1)
Basophils Relative: 0 % (ref 0–1)
EOS ABS: 0.2 10*3/uL (ref 0.0–0.7)
Eosinophils Relative: 4 % (ref 0–5)
HEMATOCRIT: 38.7 % — AB (ref 39.0–52.0)
HEMOGLOBIN: 12.9 g/dL — AB (ref 13.0–17.0)
Lymphocytes Relative: 28 % (ref 12–46)
Lymphs Abs: 1.7 10*3/uL (ref 0.7–4.0)
MCH: 29.3 pg (ref 26.0–34.0)
MCHC: 33.3 g/dL (ref 30.0–36.0)
MCV: 87.8 fL (ref 78.0–100.0)
MONO ABS: 0.5 10*3/uL (ref 0.1–1.0)
MONOS PCT: 7 % (ref 3–12)
NEUTROS ABS: 3.7 10*3/uL (ref 1.7–7.7)
Neutrophils Relative %: 61 % (ref 43–77)
PLATELETS: 219 10*3/uL (ref 150–400)
RBC: 4.41 MIL/uL (ref 4.22–5.81)
RDW: 15.2 % (ref 11.5–15.5)
WBC: 6.1 10*3/uL (ref 4.0–10.5)

## 2014-10-23 LAB — TROPONIN I: TROPONIN I: 0.07 ng/mL — AB (ref <0.031)

## 2014-10-23 LAB — BRAIN NATRIURETIC PEPTIDE: B Natriuretic Peptide: 22 pg/mL (ref 0.0–100.0)

## 2014-10-23 MED ORDER — IPRATROPIUM-ALBUTEROL 0.5-2.5 (3) MG/3ML IN SOLN
3.0000 mL | Freq: Once | RESPIRATORY_TRACT | Status: AC
  Administered 2014-10-23: 3 mL via RESPIRATORY_TRACT
  Filled 2014-10-23: qty 3

## 2014-10-23 MED ORDER — ASPIRIN 81 MG PO CHEW
324.0000 mg | CHEWABLE_TABLET | Freq: Once | ORAL | Status: AC
  Administered 2014-10-23: 324 mg via ORAL
  Filled 2014-10-23: qty 4

## 2014-10-23 MED ORDER — GI COCKTAIL ~~LOC~~
30.0000 mL | Freq: Once | ORAL | Status: AC
  Administered 2014-10-23: 30 mL via ORAL
  Filled 2014-10-23: qty 30

## 2014-10-23 NOTE — ED Provider Notes (Signed)
CSN: [id number]   Arrival date & time 10/23/14 1921  History  This chart was scribed for  Ezequiel Essex, MD by Altamease Oiler, ED Scribe. This patient was seen in room APA12/AP[facility]12 and the patient's care was started at 10:21 PM.  Chief Complaint  Patient presents with  . Shortness of Breath    HPI The history is provided by the patient. No language interpreter was used.   Ryan Rollins is a 74 y.o. male who presents to the Emergency Department complaining of increasing dry cough with onset 3 weeks ago. No improvement after z-pack, tessalon pearls, or Mucinex. Associated nasal congestion, runny nose, SOB, chest congestion, chest pain with coughing, and abdominal wall soreness that he associates with coughing. Pt denies fever and sore throat. No known sick contact. No known cardiac history or history of COPD. No recent long travel. Smokes 1 pack of cigarettes per week.   Past Medical History  Diagnosis Date  . Cerebral aneurysm   . Stroke   . Hypertension   . Hypercholesterolemia   . Prostate cancer   . CKD (chronic kidney disease) stage 3, GFR 30-59 ml/min     Past Surgical History  Procedure Laterality Date  . Cholecystectomy    . Ankle surgery    . Prostate surgery  biopsy      Family History  Problem Relation Age of Onset  . Hypertension Mother   . Diabetes Mother   . Hypertension Father   . Congestive Heart Failure Mother     History  Substance Use Topics  . Smoking status: Current Every Day Smoker -- 1.00 packs/day for 60 years    Types: Cigarettes  . Smokeless tobacco: Not on file  . Alcohol Use: 0.0 oz/week    0 Standard drinks or equivalent per week     Comment: occ     Review of Systems  Constitutional: Negative for fever.  HENT: Positive for congestion. Negative for sore throat.        Rhinorrhea   Respiratory: Positive for cough and shortness of breath. Negative for sputum production.   Cardiovascular: Positive for chest pain.  Gastrointestinal:  Positive for abdominal pain. Negative for nausea and vomiting.  All other systems reviewed and are negative.   Home Medications   Prior to Admission medications   Medication Sig Start Date End Date Taking? Authorizing Provider  albuterol (PROVENTIL HFA;VENTOLIN HFA) 108 (90 BASE) MCG/ACT inhaler Inhale 2 puffs into the lungs every 6 (six) hours as needed for wheezing or shortness of breath.   Yes Historical Provider, MD  atorvastatin (LIPITOR) 10 MG tablet Take 10 mg by mouth daily.   Yes Historical Provider, MD  clopidogrel (PLAVIX) 75 MG tablet Take 75 mg by mouth daily.   Yes Historical Provider, MD  docusate sodium (COLACE) 100 MG capsule Take 100 mg by mouth daily as needed. constipation   Yes Historical Provider, MD  enalapril (VASOTEC) 20 MG tablet Take 20 mg by mouth 2 (two) times daily.    Yes Historical Provider, MD  furosemide (LASIX) 40 MG tablet Take 20-40 mg by mouth daily as needed for fluid (take 1/2-1 tablet daily as needed.).    Yes Historical Provider, MD  HYDROcodone-acetaminophen (NORCO) 10-325 MG per tablet Take 1 tablet by mouth 3 (three) times daily as needed. pain   Yes Historical Provider, MD  levocetirizine (XYZAL) 5 MG tablet Take 5 mg by mouth every evening.   Yes Historical Provider, MD  NIFEdipine (PROCARDIA-XL/ADALAT CC) 60 MG 24  hr tablet Take 60 mg by mouth 2 (two) times daily.   Yes Historical Provider, MD  omeprazole (PRILOSEC) 40 MG capsule Take 40 mg by mouth daily.   Yes Historical Provider, MD  Tamsulosin HCl (FLOMAX) 0.4 MG CAPS Take 0.4 mg by mouth 2 (two) times daily.    Yes Historical Provider, MD  azithromycin (ZITHROMAX) 250 MG tablet Take 250-500 mg by mouth See admin instructions. TAKE TWO TABLETS ON DAY 1,M THEN TAKE ONE TABLET ON DAYS 2 THROUGH 5 10/12/14   Historical Provider, MD    Allergies  Morphine and related and Penicillins  Triage Vitals: BP 144/107 mmHg  Pulse 70  Temp(Src) 97.9 F (36.6 C) (Oral)  Resp 20  Ht 6\' 3"  (1.905 m)   Wt 328 lb (148.78 kg)  BMI 41.00 kg/m2  SpO2 97%  Physical Exam  Constitutional: He is oriented to person, place, and time. He appears well-developed and well-nourished. No distress.  HENT:  Head: Normocephalic and atraumatic.  Right Ear: Tympanic membrane normal.  Left Ear: Tympanic membrane normal.  Nose: Right sinus exhibits no maxillary sinus tenderness and no frontal sinus tenderness. Left sinus exhibits no maxillary sinus tenderness and no frontal sinus tenderness.  Mouth/Throat: Oropharynx is clear and moist. No oropharyngeal exudate.  Eyes: Conjunctivae and EOM are normal. Pupils are equal, round, and reactive to light.  Neck: Normal range of motion. Neck supple.  No meningismus.  Cardiovascular: Normal rate, regular rhythm, normal heart sounds and intact distal pulses.   No murmur heard. Pulmonary/Chest: Effort normal. No respiratory distress. He has no wheezes.  Diminished breath sounds throughout   Abdominal: Soft. There is no tenderness. There is no rebound and no guarding.  Musculoskeletal: Normal range of motion. He exhibits no edema or tenderness.  No peripheral edema  Neurological: He is alert and oriented to person, place, and time. No cranial nerve deficit. He exhibits normal muscle tone. Coordination normal.  No ataxia on finger to nose bilaterally. No pronator drift. 5/5 strength throughout. CN 2-12 intact. Negative Romberg. Equal grip strength. Sensation intact. Gait is normal.   Skin: Skin is warm.  Psychiatric: He has a normal mood and affect. His behavior is normal.  Nursing note and vitals reviewed.   ED Course  Procedures   DIAGNOSTIC STUDIES: Oxygen Saturation is 97% on RA, normal by my interpretation.    COORDINATION OF CARE: 10:26 PM Discussed treatment plan which includes CXR, EKG, lab work, and GI cocktail with pt at bedside and pt agreed to plan.  Labs Review-  Labs Reviewed  CBC WITH DIFFERENTIAL/PLATELET - Abnormal; Notable for the following:     Hemoglobin 12.9 (*)    HCT 38.7 (*)    All other components within normal limits  BASIC METABOLIC PANEL - Abnormal; Notable for the following:    Potassium 3.2 (*)    Glucose, Bld 110 (*)    BUN 26 (*)    Creatinine, Ser 2.72 (*)    Calcium 8.4 (*)    GFR calc non Af Amer 22 (*)    GFR calc Af Amer 25 (*)    All other components within normal limits  TROPONIN I - Abnormal; Notable for the following:    Troponin I 0.07 (*)    All other components within normal limits  BRAIN NATRIURETIC PEPTIDE  CBC WITH DIFFERENTIAL/PLATELET  TROPONIN I  TROPONIN I  TROPONIN I  CK TOTAL AND CKMB (NOT AT [facility])  COMPREHENSIVE METABOLIC PANEL  HEMOGLOBIN A1C    Imaging  Review Dg Chest 2 View  10/23/2014   CLINICAL DATA:  Shortness of breath with productive cough for 3 weeks. No response to antibiotic therapy. Initial encounter.  EXAM: CHEST  2 VIEW  COMPARISON:  03/27/2014 and 06/30/2013 radiographs.  FINDINGS: Mild motion on the lateral view. The heart size and mediastinal contours are stable. The lungs are clear. There is mild chronic central airway thickening, best seen on the lateral view. No pleural effusion or pneumothorax. The bones appear unremarkable. Cholecystectomy clips noted.  IMPRESSION: Stable chronic central airway thickening attributed to smoking. No acute cardiopulmonary process.   Electronically Signed   By: Richardean Sale M.D.   On: 10/23/2014 20:56    EKG Interpretation  Date/Time:    Ventricular Rate:    PR Interval:    QRS Duration:   QT Interval:    QTC Calculation:   R Axis:     Text Interpretation:      EKG Interpretation  Date/Time:    Ventricular Rate:    PR Interval:    QRS Duration:   QT Interval:    QTC Calculation:   R Axis:     Text Interpretation:            MDM   Final diagnoses:  Bronchitis  Elevated troponin  CKD (chronic kidney disease), unspecified stage   3 weeks of cough and SOB.  Treated with zithromax by PCP.  Smoker.  Some  DOE with exertion.  Chest pain only with coughing.  No distress, lungs clear. EKG unchanged.  Suspect bronchitis or COPD.  Nebs given.  Troponin elevation uncertain significance in setting of CKD but patient has had normal troponins in past.   Creatinine at baseline. Significant CV risk factors with atypical chest pain with coughing. No CAD per patient but has had "4 strokes" and is on plavix.  Admit for serial troponins and monitoring.  D/w Dr Maudie Mercury.       I personally performed the services described in this documentation, which was scribed in my presence. The recorded information has been reviewed and is accurate.   Ezequiel Essex, MD 10/24/14 [phone]

## 2014-10-23 NOTE — ED Notes (Signed)
Pt c/o sob and productive cough x 3 weeks; pt states he went to his PCP and was given an antibiotic and tessalon pearls with no relief; pt states he tries to cough up the mucous but it wont come up.

## 2014-10-24 ENCOUNTER — Observation Stay (HOSPITAL_BASED_OUTPATIENT_CLINIC_OR_DEPARTMENT_OTHER): Payer: Medicare Other

## 2014-10-24 ENCOUNTER — Encounter (HOSPITAL_COMMUNITY): Payer: Self-pay | Admitting: Internal Medicine

## 2014-10-24 DIAGNOSIS — I340 Nonrheumatic mitral (valve) insufficiency: Secondary | ICD-10-CM

## 2014-10-24 DIAGNOSIS — J40 Bronchitis, not specified as acute or chronic: Secondary | ICD-10-CM | POA: Diagnosis not present

## 2014-10-24 DIAGNOSIS — N189 Chronic kidney disease, unspecified: Secondary | ICD-10-CM | POA: Diagnosis not present

## 2014-10-24 DIAGNOSIS — R7989 Other specified abnormal findings of blood chemistry: Secondary | ICD-10-CM | POA: Diagnosis not present

## 2014-10-24 DIAGNOSIS — R739 Hyperglycemia, unspecified: Secondary | ICD-10-CM | POA: Diagnosis not present

## 2014-10-24 DIAGNOSIS — R05 Cough: Secondary | ICD-10-CM | POA: Diagnosis not present

## 2014-10-24 DIAGNOSIS — R059 Cough, unspecified: Secondary | ICD-10-CM

## 2014-10-24 DIAGNOSIS — N184 Chronic kidney disease, stage 4 (severe): Secondary | ICD-10-CM | POA: Insufficient documentation

## 2014-10-24 DIAGNOSIS — E876 Hypokalemia: Secondary | ICD-10-CM

## 2014-10-24 LAB — CK TOTAL AND CKMB (NOT AT ARMC)
CK, MB: 6 ng/mL — ABNORMAL HIGH (ref 0.5–5.0)
Relative Index: 2.5 (ref 0.0–2.5)
Total CK: 239 U/L (ref 49–397)

## 2014-10-24 LAB — COMPREHENSIVE METABOLIC PANEL
ALT: 28 U/L (ref 17–63)
ANION GAP: 7 (ref 5–15)
AST: 17 U/L (ref 15–41)
Albumin: 4.2 g/dL (ref 3.5–5.0)
Alkaline Phosphatase: 113 U/L (ref 38–126)
BILIRUBIN TOTAL: 0.3 mg/dL (ref 0.3–1.2)
BUN: 25 mg/dL — ABNORMAL HIGH (ref 6–20)
CALCIUM: 8.7 mg/dL — AB (ref 8.9–10.3)
CO2: 26 mmol/L (ref 22–32)
CREATININE: 2.6 mg/dL — AB (ref 0.61–1.24)
Chloride: 108 mmol/L (ref 101–111)
GFR calc non Af Amer: 23 mL/min — ABNORMAL LOW (ref >60)
GFR, EST AFRICAN AMERICAN: 27 mL/min — AB (ref >60)
Glucose, Bld: 133 mg/dL — ABNORMAL HIGH (ref 65–99)
Potassium: 3.2 mmol/L — ABNORMAL LOW (ref 3.5–5.1)
Sodium: 141 mmol/L (ref 135–145)
TOTAL PROTEIN: 7 g/dL (ref 6.5–8.1)

## 2014-10-24 LAB — CBC WITH DIFFERENTIAL/PLATELET
BASOS ABS: 0 10*3/uL (ref 0.0–0.1)
BASOS PCT: 0 % (ref 0–1)
Eosinophils Absolute: 0.2 10*3/uL (ref 0.0–0.7)
Eosinophils Relative: 4 % (ref 0–5)
HCT: 39.9 % (ref 39.0–52.0)
Hemoglobin: 13.2 g/dL (ref 13.0–17.0)
Lymphocytes Relative: 31 % (ref 12–46)
Lymphs Abs: 2 10*3/uL (ref 0.7–4.0)
MCH: 29.1 pg (ref 26.0–34.0)
MCHC: 33.1 g/dL (ref 30.0–36.0)
MCV: 87.9 fL (ref 78.0–100.0)
Monocytes Absolute: 0.5 10*3/uL (ref 0.1–1.0)
Monocytes Relative: 8 % (ref 3–12)
Neutro Abs: 3.7 10*3/uL (ref 1.7–7.7)
Neutrophils Relative %: 57 % (ref 43–77)
Platelets: 221 10*3/uL (ref 150–400)
RBC: 4.54 MIL/uL (ref 4.22–5.81)
RDW: 15.3 % (ref 11.5–15.5)
WBC: 6.4 10*3/uL (ref 4.0–10.5)

## 2014-10-24 LAB — TROPONIN I
TROPONIN I: 0.06 ng/mL — AB (ref <0.031)
TROPONIN I: 0.06 ng/mL — AB (ref <0.031)
TROPONIN I: 0.05 ng/mL — AB (ref <0.031)

## 2014-10-24 MED ORDER — IPRATROPIUM-ALBUTEROL 0.5-2.5 (3) MG/3ML IN SOLN
3.0000 mL | Freq: Four times a day (QID) | RESPIRATORY_TRACT | Status: DC
  Administered 2014-10-24 (×3): 3 mL via RESPIRATORY_TRACT
  Filled 2014-10-24 (×2): qty 3

## 2014-10-24 MED ORDER — LORATADINE 10 MG PO TABS
10.0000 mg | ORAL_TABLET | Freq: Every day | ORAL | Status: DC
  Administered 2014-10-24: 10 mg via ORAL
  Filled 2014-10-24: qty 1

## 2014-10-24 MED ORDER — ACETAMINOPHEN 325 MG PO TABS: 650.0000 mg | ORAL_TABLET | Freq: Four times a day (QID) | ORAL | Status: DC | PRN

## 2014-10-24 MED ORDER — GUAIFENESIN ER 600 MG PO TB12
1200.0000 mg | ORAL_TABLET | Freq: Two times a day (BID) | ORAL | Status: DC
  Administered 2014-10-24 – 2014-10-25 (×3): 1200 mg via ORAL
  Filled 2014-10-24 (×3): qty 2

## 2014-10-24 MED ORDER — DEXTROSE 5 % IV SOLN
INTRAVENOUS | Status: AC
  Filled 2014-10-24: qty 500

## 2014-10-24 MED ORDER — ENOXAPARIN SODIUM 40 MG/0.4ML ~~LOC~~ SOLN
40.0000 mg | SUBCUTANEOUS | Status: DC
  Administered 2014-10-24 – 2014-10-25 (×2): 40 mg via SUBCUTANEOUS
  Filled 2014-10-24 (×2): qty 0.4

## 2014-10-24 MED ORDER — NIFEDIPINE ER OSMOTIC RELEASE 30 MG PO TB24
60.0000 mg | ORAL_TABLET | Freq: Two times a day (BID) | ORAL | Status: DC
  Administered 2014-10-24 – 2014-10-25 (×4): 60 mg via ORAL
  Filled 2014-10-24 (×4): qty 2

## 2014-10-24 MED ORDER — NICOTINE 21 MG/24HR TD PT24
21.0000 mg | MEDICATED_PATCH | Freq: Every day | TRANSDERMAL | Status: DC
  Administered 2014-10-24 – 2014-10-25 (×2): 21 mg via TRANSDERMAL
  Filled 2014-10-24 (×2): qty 1

## 2014-10-24 MED ORDER — SODIUM CHLORIDE 0.9 % IJ SOLN
3.0000 mL | Freq: Two times a day (BID) | INTRAMUSCULAR | Status: DC
  Administered 2014-10-24: 3 mL via INTRAVENOUS

## 2014-10-24 MED ORDER — ALBUTEROL SULFATE (2.5 MG/3ML) 0.083% IN NEBU: 2.5000 mg | INHALATION_SOLUTION | Freq: Four times a day (QID) | RESPIRATORY_TRACT | Status: DC | PRN

## 2014-10-24 MED ORDER — DOCUSATE SODIUM 100 MG PO CAPS
100.0000 mg | ORAL_CAPSULE | Freq: Every day | ORAL | Status: DC | PRN
  Administered 2014-10-25: 100 mg via ORAL
  Filled 2014-10-24: qty 1

## 2014-10-24 MED ORDER — SODIUM CHLORIDE 0.9 % IJ SOLN
3.0000 mL | Freq: Two times a day (BID) | INTRAMUSCULAR | Status: DC
  Administered 2014-10-24 (×3): 3 mL via INTRAVENOUS

## 2014-10-24 MED ORDER — TIOTROPIUM BROMIDE MONOHYDRATE 18 MCG IN CAPS
18.0000 ug | ORAL_CAPSULE | Freq: Every day | RESPIRATORY_TRACT | Status: DC
  Administered 2014-10-25: 18 ug via RESPIRATORY_TRACT
  Filled 2014-10-24: qty 5

## 2014-10-24 MED ORDER — BENZONATATE 100 MG PO CAPS
100.0000 mg | ORAL_CAPSULE | Freq: Three times a day (TID) | ORAL | Status: DC
  Administered 2014-10-24 – 2014-10-25 (×4): 100 mg via ORAL
  Filled 2014-10-24 (×4): qty 1

## 2014-10-24 MED ORDER — POTASSIUM CHLORIDE CRYS ER 20 MEQ PO TBCR
30.0000 meq | EXTENDED_RELEASE_TABLET | ORAL | Status: AC
  Administered 2014-10-24 (×2): 30 meq via ORAL
  Filled 2014-10-24 (×4): qty 1

## 2014-10-24 MED ORDER — POTASSIUM CHLORIDE CRYS ER 20 MEQ PO TBCR
20.0000 meq | EXTENDED_RELEASE_TABLET | Freq: Once | ORAL | Status: AC
  Administered 2014-10-24: 20 meq via ORAL
  Filled 2014-10-24: qty 1

## 2014-10-24 MED ORDER — LEVOCETIRIZINE DIHYDROCHLORIDE 5 MG PO TABS: 5.0000 mg | ORAL_TABLET | Freq: Every evening | ORAL | Status: DC

## 2014-10-24 MED ORDER — IPRATROPIUM-ALBUTEROL 0.5-2.5 (3) MG/3ML IN SOLN
3.0000 mL | Freq: Three times a day (TID) | RESPIRATORY_TRACT | Status: DC
  Administered 2014-10-25: 3 mL via RESPIRATORY_TRACT
  Filled 2014-10-24: qty 3

## 2014-10-24 MED ORDER — ENALAPRIL MALEATE 5 MG PO TABS
20.0000 mg | ORAL_TABLET | Freq: Two times a day (BID) | ORAL | Status: DC
  Administered 2014-10-24 – 2014-10-25 (×4): 20 mg via ORAL
  Filled 2014-10-24 (×4): qty 4

## 2014-10-24 MED ORDER — SODIUM CHLORIDE 0.9 % IV SOLN: 250.0000 mL | INTRAVENOUS | Status: DC | PRN

## 2014-10-24 MED ORDER — FUROSEMIDE 20 MG PO TABS
20.0000 mg | ORAL_TABLET | Freq: Every day | ORAL | Status: DC | PRN
  Administered 2014-10-25: 40 mg via ORAL
  Filled 2014-10-24: qty 2

## 2014-10-24 MED ORDER — SODIUM CHLORIDE 0.9 % IV SOLN
INTRAVENOUS | Status: DC
  Administered 2014-10-24: 02:00:00 via INTRAVENOUS

## 2014-10-24 MED ORDER — HYDROCODONE-ACETAMINOPHEN 10-325 MG PO TABS: 1.0000 | ORAL_TABLET | Freq: Three times a day (TID) | ORAL | Status: DC | PRN

## 2014-10-24 MED ORDER — TAMSULOSIN HCL 0.4 MG PO CAPS
0.4000 mg | ORAL_CAPSULE | Freq: Two times a day (BID) | ORAL | Status: DC
  Administered 2014-10-24 – 2014-10-25 (×4): 0.4 mg via ORAL
  Filled 2014-10-24 (×4): qty 1

## 2014-10-24 MED ORDER — ATORVASTATIN CALCIUM 10 MG PO TABS
10.0000 mg | ORAL_TABLET | Freq: Every day | ORAL | Status: DC
  Administered 2014-10-24 – 2014-10-25 (×2): 10 mg via ORAL
  Filled 2014-10-24 (×2): qty 1

## 2014-10-24 MED ORDER — SODIUM CHLORIDE 0.9 % IJ SOLN: 3.0000 mL | INTRAMUSCULAR | Status: DC | PRN

## 2014-10-24 MED ORDER — PANTOPRAZOLE SODIUM 40 MG PO TBEC
40.0000 mg | DELAYED_RELEASE_TABLET | Freq: Every day | ORAL | Status: DC
  Administered 2014-10-24 – 2014-10-25 (×2): 40 mg via ORAL
  Filled 2014-10-24 (×2): qty 1

## 2014-10-24 MED ORDER — MOMETASONE FURO-FORMOTEROL FUM 100-5 MCG/ACT IN AERO
2.0000 | INHALATION_SPRAY | Freq: Two times a day (BID) | RESPIRATORY_TRACT | Status: DC
  Administered 2014-10-24 – 2014-10-25 (×2): 2 via RESPIRATORY_TRACT
  Filled 2014-10-24: qty 8.8

## 2014-10-24 MED ORDER — AZITHROMYCIN 500 MG IV SOLR
500.0000 mg | INTRAVENOUS | Status: DC
  Administered 2014-10-24 – 2014-10-25 (×2): 500 mg via INTRAVENOUS
  Filled 2014-10-24 (×3): qty 500

## 2014-10-24 MED ORDER — PANTOPRAZOLE SODIUM 40 MG PO TBEC
40.0000 mg | DELAYED_RELEASE_TABLET | Freq: Once | ORAL | Status: AC
  Administered 2014-10-24: 40 mg via ORAL
  Filled 2014-10-24: qty 1

## 2014-10-24 MED ORDER — CLOPIDOGREL BISULFATE 75 MG PO TABS
75.0000 mg | ORAL_TABLET | Freq: Every day | ORAL | Status: DC
  Administered 2014-10-24 – 2014-10-25 (×2): 75 mg via ORAL
  Filled 2014-10-24 (×2): qty 1

## 2014-10-24 MED ORDER — ACETAMINOPHEN 650 MG RE SUPP: 650.0000 mg | Freq: Four times a day (QID) | RECTAL | Status: DC | PRN

## 2014-10-24 NOTE — H&P (Addendum)
Ryan Rollins is an 74 y.o. male.    Ryan Rollins (pcp)   Chief Complaint: cough HPI: 74 yo male with hx of htn, hyperlipidemia, CVA, prostate cancer apparently c/o cough for the past 2 weeks.  White sputum.  Slight sob due to congestion per pt.  Denies fever, chills, cp, palp,  n/v, diarrhea, brbpr, black stool.  Pt was evaluated in the ED and found to have elevation in trop 0.07,  ED requested admission for trop elevation.  Pt also noted to be mildy hypokalemic, and to have renal insufficiency.   Past Medical History  Diagnosis Date  . Cerebral aneurysm   . Stroke   . Hypertension   . Hypercholesterolemia   . Prostate cancer     Past Surgical History  Procedure Laterality Date  . Cholecystectomy    . Ankle surgery    . Prostate surgery  biopsy      Family History  Problem Relation Age of Onset  . Hypertension Mother   . Diabetes Mother   . Hypertension Father   . Congestive Heart Failure Mother    Social History:  reports that he has been smoking Cigarettes.  He has a 60 pack-year smoking history. He does not have any smokeless tobacco history on file. He reports that he drinks alcohol. He reports that he does not use illicit drugs.  Allergies:  Allergies  Allergen Reactions  . Morphine And Related Hives  . Penicillins Hives  medications reviewed   Results for orders placed or performed during the hospital encounter of 10/23/14 (from the past 48 hour(s))  CBC with Differential/Platelet     Status: Abnormal   Collection Time: 10/23/14 10:22 PM  Result Value Ref Range   WBC 6.1 4.0 - 10.5 K/uL   RBC 4.41 4.22 - 5.81 MIL/uL   Hemoglobin 12.9 (L) 13.0 - 17.0 g/dL   HCT 38.7 (L) 39.0 - 52.0 %   MCV 87.8 78.0 - 100.0 fL   MCH 29.3 26.0 - 34.0 pg   MCHC 33.3 30.0 - 36.0 g/dL   RDW 15.2 11.5 - 15.5 %   Platelets 219 150 - 400 K/uL   Neutrophils Relative % 61 43 - 77 %   Neutro Abs 3.7 1.7 - 7.7 K/uL   Lymphocytes Relative 28 12 - 46 %   Lymphs Abs 1.7 0.7 - 4.0  K/uL   Monocytes Relative 7 3 - 12 %   Monocytes Absolute 0.5 0.1 - 1.0 K/uL   Eosinophils Relative 4 0 - 5 %   Eosinophils Absolute 0.2 0.0 - 0.7 K/uL   Basophils Relative 0 0 - 1 %   Basophils Absolute 0.0 0.0 - 0.1 K/uL  Basic metabolic panel     Status: Abnormal   Collection Time: 10/23/14 10:22 PM  Result Value Ref Range   Sodium 142 135 - 145 mmol/L   Potassium 3.2 (L) 3.5 - 5.1 mmol/L   Chloride 108 101 - 111 mmol/L   CO2 24 22 - 32 mmol/L   Glucose, Bld 110 (H) 65 - 99 mg/dL   BUN 26 (H) 6 - 20 mg/dL   Creatinine, Ser 2.72 (H) 0.61 - 1.24 mg/dL   Calcium 8.4 (L) 8.9 - 10.3 mg/dL   GFR calc non Af Amer 22 (L) >60 mL/min   GFR calc Af Amer 25 (L) >60 mL/min    Comment: (NOTE) The eGFR has been calculated using the CKD EPI equation. This calculation has not been validated in all clinical situations.  eGFR's persistently <60 mL/min signify possible Chronic Kidney Disease.    Anion gap 10 5 - 15  Troponin I     Status: Abnormal   Collection Time: 10/23/14 10:22 PM  Result Value Ref Range   Troponin I 0.07 (H) <0.031 ng/mL    Comment:        PERSISTENTLY INCREASED TROPONIN VALUES IN THE RANGE OF 0.04-0.49 ng/mL CAN BE SEEN IN:       -UNSTABLE ANGINA       -CONGESTIVE HEART FAILURE       -MYOCARDITIS       -CHEST TRAUMA       -ARRYHTHMIAS       -LATE PRESENTING MYOCARDIAL INFARCTION       -COPD   CLINICAL FOLLOW-UP RECOMMENDED.   Brain natriuretic peptide     Status: None   Collection Time: 10/23/14 10:22 PM  Result Value Ref Range   B Natriuretic Peptide 22.0 0.0 - 100.0 pg/mL   Dg Chest 2 View  10/23/2014   CLINICAL DATA:  Shortness of breath with productive cough for 3 weeks. No response to antibiotic therapy. Initial encounter.  EXAM: CHEST  2 VIEW  COMPARISON:  03/27/2014 and 06/30/2013 radiographs.  FINDINGS: Mild motion on the lateral view. The heart size and mediastinal contours are stable. The lungs are clear. There is mild chronic central airway thickening,  best seen on the lateral view. No pleural effusion or pneumothorax. The bones appear unremarkable. Cholecystectomy clips noted.  IMPRESSION: Stable chronic central airway thickening attributed to smoking. No acute cardiopulmonary process.   Electronically Signed   By: Richardean Sale M.D.   On: 10/23/2014 20:56    Review of Systems  Constitutional: Negative for fever, chills, weight loss, malaise/fatigue and diaphoresis.  HENT: Negative.   Eyes: Negative.   Respiratory: Positive for cough, sputum production and shortness of breath. Negative for hemoptysis and wheezing.   Cardiovascular: Negative.   Gastrointestinal: Negative.   Genitourinary: Negative.   Musculoskeletal: Negative.   Skin: Negative.   Neurological: Negative.  Negative for weakness.  Endo/Heme/Allergies: Negative.   Psychiatric/Behavioral: Negative.     Blood pressure 153/81, pulse 64, temperature 97.9 F (36.6 C), temperature source Oral, resp. rate 18, height 6' 3"  (1.905 m), weight 148.78 kg (328 lb), SpO2 100 %. Physical Exam  Constitutional: He is oriented to person, place, and time. He appears well-developed and well-nourished.  HENT:  Head: Normocephalic and atraumatic.  Mouth/Throat: No oropharyngeal exudate.  Eyes: Conjunctivae and EOM are normal. Pupils are equal, round, and reactive to light. No scleral icterus.  Neck: Normal range of motion. Neck supple. No JVD present. No tracheal deviation present. No thyromegaly present.  Cardiovascular: Normal rate and regular rhythm.  Exam reveals no gallop and no friction rub.   No murmur heard. Respiratory: Effort normal and breath sounds normal. No respiratory distress. He has no wheezes. He has no rales.  GI: Soft. Bowel sounds are normal. He exhibits no distension. There is no tenderness. There is no rebound and no guarding.  Musculoskeletal: Normal range of motion. He exhibits no edema or tenderness.  Lymphadenopathy:    He has no cervical adenopathy.   Neurological: He is alert and oriented to person, place, and time. He has normal reflexes. He displays normal reflexes. No cranial nerve deficit. He exhibits normal muscle tone. Coordination normal.  Skin: Skin is warm and dry. No rash noted. No erythema. No pallor.  Psychiatric: He has a normal mood and affect. His behavior is normal.  Judgment and thought content normal.     Assessment/Plan Cough secondary to bronchitis vs Copd zithromax 530m iv qday spiriva 1puff qday Albuterol 1 neb q6h prn advair 250/50 1puff bid Tessalon perles  + trop ? Check trop i q6h x3 Check cardiac echo Nuclear stress tet  ckd stage 3 Check cmp in am  Hyperglycemia Check hga1c  Hypokalemia Potassium k dur 232m po x1 Check cmp in am  Tobacco dependence Pt was counselled on smoking cessation x [id number] Nicotine patch  DVT prophylaxis:  Scd, lovenox  Charls Custer, [place] 10/24/2014, 12:16 AM

## 2014-10-24 NOTE — Progress Notes (Signed)
Patient Demographics  Ryan Rollins, is a 74 y.o. male, DOB - [birth year], PYP:[id number]  Admit date - 10/23/2014   Admitting Physician Jani Gravel, MD  Outpatient Primary MD for the patient is Glenda Chroman., MD  LOS -    Chief Complaint  Patient presents with  . Shortness of Breath       Admission HPI/Brief narrative:  74 yo male with hx of htn, hyperlipidemia, CVA, prostate cancer ,c/o cough for the past 2 weeks. White sputum. Slight sob due to congestion , in the ED and found to have elevation in trop 0.07, ED requested admission for trop elevation. Pt also noted to be mildy hypokalemic, and to have renal insufficiency ( creatinine at baseline).  Subjective:   Ryan Rollins today has, No headache, No chest pain, No abdominal pain - No Nausea, No new weakness tingling or numbness, complains of cough and congestion.  Assessment & Plan    Active Problems:   Elevated troponin   Cough   Hyperglycemia   Hypokalemia  Acute  Bronchitis - No evidence of pneumonia on chest x-ray, continue with azithromycin, will start on flutter valve, Mucinex, nebs.  Elevated troponin - Denies any chest pain or shortness of breath, is most likely due to his chronic kidney disease,. - troponins mildly elevated initially, trended down 0.07>>0.06>>0.06 - EKG non acute - follow On 2-D echo   Chronic kidney disease stage IV -  Appears  to be at baseline, stable - follow  closely as on enalapril (will continue as renal function is stable) - We'll resume Lasix tomorrow  Hypokalemia - Repleted, recheck in a.m.  Tobacco dependence - Nicotine patch  History of CVA - continue  with Plavix and statin  Code Status: Full  Family Communication: wife at bedside  Disposition Plan: home in am if remains stable.   Procedures  none   Consults   none   Medications  Scheduled Meds: . atorvastatin   10 mg Oral Daily  . azithromycin  500 mg Intravenous Q24H  . benzonatate  100 mg Oral TID  . clopidogrel  75 mg Oral Daily  . enalapril  20 mg Oral BID  . enoxaparin (LOVENOX) injection  40 mg Subcutaneous Q24H  . guaiFENesin  1,200 mg Oral BID  . ipratropium-albuterol  3 mL Nebulization QID  . loratadine  10 mg Oral q1800  . mometasone-formoterol  2 puff Inhalation BID  . nicotine  21 mg Transdermal Daily  . NIFEdipine  60 mg Oral BID  . pantoprazole  40 mg Oral Daily  . sodium chloride  3 mL Intravenous Q12H  . sodium chloride  3 mL Intravenous Q12H  . tamsulosin  0.4 mg Oral BID  . tiotropium  18 mcg Inhalation Daily   Continuous Infusions:  PRN Meds:.sodium chloride, acetaminophen **OR** acetaminophen, albuterol, docusate sodium, furosemide, HYDROcodone-acetaminophen, sodium chloride  DVT Prophylaxis  Lovenox -  Lab Results  Component Value Date   PLT 221 10/24/2014    Antibiotics    Anti-infectives    Start     Dose/Rate Route Frequency Ordered Stop   10/24/14 0130  azithromycin (ZITHROMAX) 500 mg in dextrose 5 % 250 mL IVPB     500 mg 250 mL/hr over 60 Minutes Intravenous Every  24 hours 10/24/14 0117            Objective:   Filed Vitals:   10/24/14 0030 10/24/14 0100 10/24/14 0120 10/24/14 0609  BP: 122/90 140/63 154/84 122/66  Pulse: 71 77 68 66  Temp:   97.6 F (36.4 C) 98.3 F (36.8 C)  TempSrc:   Oral Oral  Resp: 17 17 18 18   Height:   6\' 3"  (1.905 m)   Weight:   140.479 kg (309 lb 11.2 oz)   SpO2: 98% 97% 98% 96%    Wt Readings from Last 3 Encounters:  10/24/14 140.479 kg (309 lb 11.2 oz)  03/27/14 142.883 kg (315 lb)  03/26/14 142.883 kg (315 lb)     Intake/Output Summary (Last 24 hours) at 10/24/14 1135 Last data filed at 10/24/14 0600  Gross per 24 hour  Intake    545 ml  Output    950 ml  Net   -405 ml     Physical Exam  Awake Alert, Oriented X 3, No new F.N deficits, Normal affect [place].AT,PERRAL Supple Neck,No JVD, No cervical  lymphadenopathy appriciated.  Symmetrical Chest wall movement, Good air movement bilaterally, CTAB RRR,No Gallops,Rubs or new Murmurs, No Parasternal Heave +ve B.Sounds, Abd Soft, No tenderness, No organomegaly appriciated, No rebound - guarding or rigidity. No Cyanosis, Clubbing or edema, No new Rash or bruise     Data Review   Micro Results No results found for this or any previous visit (from the past 240 hour(s)).  Radiology Reports Dg Chest 2 View  10/23/2014   CLINICAL DATA:  Shortness of breath with productive cough for 3 weeks. No response to antibiotic therapy. Initial encounter.  EXAM: CHEST  2 VIEW  COMPARISON:  03/27/2014 and 06/30/2013 radiographs.  FINDINGS: Mild motion on the lateral view. The heart size and mediastinal contours are stable. The lungs are clear. There is mild chronic central airway thickening, best seen on the lateral view. No pleural effusion or pneumothorax. The bones appear unremarkable. Cholecystectomy clips noted.  IMPRESSION: Stable chronic central airway thickening attributed to smoking. No acute cardiopulmonary process.   Electronically Signed   By: Richardean Sale M.D.   On: 10/23/2014 20:56     CBC  Recent Labs Lab 10/23/14 2222 10/24/14 0141  WBC 6.1 6.4  HGB 12.9* 13.2  HCT 38.7* 39.9  PLT 219 221  MCV 87.8 87.9  MCH 29.3 29.1  MCHC 33.3 33.1  RDW 15.2 15.3  LYMPHSABS 1.7 2.0  MONOABS 0.5 0.5  EOSABS 0.2 0.2  BASOSABS 0.0 0.0    Chemistries   Recent Labs Lab 10/23/14 2222 10/24/14 0141  NA 142 141  K 3.2* 3.2*  CL 108 108  CO2 24 26  GLUCOSE 110* 133*  BUN 26* 25*  CREATININE 2.72* 2.60*  CALCIUM 8.4* 8.7*  AST  --  17  ALT  --  28  ALKPHOS  --  113  BILITOT  --  0.3   ------------------------------------------------------------------------------------------------------------------ estimated creatinine clearance is 39.4 mL/min (by C-G formula based on Cr of  2.6). ------------------------------------------------------------------------------------------------------------------ No results for input(s): HGBA1C in the last 72 hours. ------------------------------------------------------------------------------------------------------------------ No results for input(s): CHOL, HDL, LDLCALC, TRIG, CHOLHDL, LDLDIRECT in the last 72 hours. ------------------------------------------------------------------------------------------------------------------ No results for input(s): TSH, T4TOTAL, T3FREE, THYROIDAB in the last 72 hours.  Invalid input(s): FREET3 ------------------------------------------------------------------------------------------------------------------ No results for input(s): VITAMINB12, FOLATE, FERRITIN, TIBC, IRON, RETICCTPCT in the last 72 hours.  Coagulation profile No results for input(s): INR, PROTIME in the last 168 hours.  No results for input(s): DDIMER in the last 72 hours.  Cardiac Enzymes  Recent Labs Lab 10/23/14 2222 10/24/14 0141 10/24/14 0735  TROPONINI 0.07* 0.06* 0.06*   ------------------------------------------------------------------------------------------------------------------ Invalid input(s): POCBNP     Time Spent in minutes   30 minutes   ELGERGAWY, DAWOOD M.D on 10/24/2014 at 11:35 AM  Between 7am to 7pm - Pager - [phone]  After 7pm go to [email or web address] - password [email or web address]  [facility]   Office  [phone]

## 2014-10-24 NOTE — Care Management Note (Signed)
Case Management Note  Patient Details  Name: JAYDIS DUCHENE MRN: [id number] Date of Birth: 31-Dec-1940  Subjective/Objective:                  Pt admitted from home with cough and elevated troponins. Pt lives alone and will return home at discharge. Pt is independent with ADl's. Pt has a rollator and cane for prn use.   Action/Plan: Pt signed Medicare OBS form and form placed on shadow chart. No CM needs noted.  Expected Discharge Date:  10/26/14               Expected Discharge Plan:  Home/Self Care  In-House Referral:  NA  Discharge planning Services  CM Consult  Post Acute Care Choice:  NA Choice offered to:  NA  DME Arranged:    DME Agency:     HH Arranged:    HH Agency:     Status of Service:  Completed, signed off  Medicare Important Message Given:    Date Medicare IM Given:    Medicare IM give by:    Date Additional Medicare IM Given:    Additional Medicare Important Message give by:     If discussed at [place] of Stay Meetings, dates discussed:    Additional Comments:  Joylene Draft, RN 10/24/2014, 2:44 PM

## 2014-10-25 DIAGNOSIS — R7989 Other specified abnormal findings of blood chemistry: Secondary | ICD-10-CM | POA: Diagnosis not present

## 2014-10-25 DIAGNOSIS — E876 Hypokalemia: Secondary | ICD-10-CM | POA: Diagnosis not present

## 2014-10-25 DIAGNOSIS — R05 Cough: Secondary | ICD-10-CM

## 2014-10-25 DIAGNOSIS — R109 Unspecified abdominal pain: Secondary | ICD-10-CM | POA: Diagnosis not present

## 2014-10-25 DIAGNOSIS — J40 Bronchitis, not specified as acute or chronic: Secondary | ICD-10-CM | POA: Diagnosis not present

## 2014-10-25 DIAGNOSIS — R0981 Nasal congestion: Secondary | ICD-10-CM | POA: Diagnosis not present

## 2014-10-25 LAB — CBC
HEMATOCRIT: 36.9 % — AB (ref 39.0–52.0)
HEMOGLOBIN: 12.2 g/dL — AB (ref 13.0–17.0)
MCH: 29 pg (ref 26.0–34.0)
MCHC: 33.1 g/dL (ref 30.0–36.0)
MCV: 87.9 fL (ref 78.0–100.0)
Platelets: 216 10*3/uL (ref 150–400)
RBC: 4.2 MIL/uL — ABNORMAL LOW (ref 4.22–5.81)
RDW: 15.2 % (ref 11.5–15.5)
WBC: 4.8 10*3/uL (ref 4.0–10.5)

## 2014-10-25 LAB — BASIC METABOLIC PANEL
Anion gap: 7 (ref 5–15)
BUN: 15 mg/dL (ref 6–20)
CHLORIDE: 108 mmol/L (ref 101–111)
CO2: 24 mmol/L (ref 22–32)
Calcium: 8.9 mg/dL (ref 8.9–10.3)
Creatinine, Ser: 1.92 mg/dL — ABNORMAL HIGH (ref 0.61–1.24)
GFR calc non Af Amer: 33 mL/min — ABNORMAL LOW (ref >60)
GFR, EST AFRICAN AMERICAN: 39 mL/min — AB (ref >60)
Glucose, Bld: 95 mg/dL (ref 65–99)
POTASSIUM: 3.5 mmol/L (ref 3.5–5.1)
SODIUM: 139 mmol/L (ref 135–145)

## 2014-10-25 LAB — HEMOGLOBIN A1C
Hgb A1c MFr Bld: 6.1 % — ABNORMAL HIGH (ref 4.8–5.6)
MEAN PLASMA GLUCOSE: 128 mg/dL

## 2014-10-25 MED ORDER — PREDNISONE 10 MG PO TABS: ORAL_TABLET | ORAL | Status: DC

## 2014-10-25 MED ORDER — GUAIFENESIN ER 600 MG PO TB12: 1200.0000 mg | ORAL_TABLET | Freq: Two times a day (BID) | ORAL | Status: DC

## 2014-10-25 NOTE — Discharge Summary (Signed)
Physician Discharge Summary  Ryan Rollins GYJ:[id number] DOB: June 06, 1940 DOA: 10/23/2014  PCP: Glenda Chroman., MD  Admit date: 10/23/2014 Discharge date: 10/25/2014  Time spent: 25* minutes  Recommendations for Outpatient Follow-up:    Follow up PCP in 2 weeks  Discharge Diagnoses:  Active Problems:   Elevated troponin   Cough   Hyperglycemia   Hypokalemia   Discharge Condition: Stable  Diet recommendation: Low salt diet  Filed Weights   10/23/14 2008 10/24/14 0120 10/25/14 0541  Weight: 148.78 kg (328 lb) 140.479 kg (309 lb 11.2 oz) 140.615 kg (310 lb)    History of present illness:  74 yo male with hx of htn, hyperlipidemia, CVA, prostate cancer ,c/o cough for the past 2 weeks. White sputum. Slight sob due to congestion , in the ED and found to have elevation in trop 0.07, ED requested admission for trop elevation. Pt also noted to be mildy hypokalemic, and to have renal insufficiency ( creatinine at baseline).  Hospital Course:   Acute Bronchitis - No evidence of pneumonia on chest x-ray, patient improved with zithromax, mucinex,  he has recently completed the course of zithromax at home, will d/c the zithromax. Send home on prednisone taper and Mucinex, continue albuterol prn  Elevated troponin - Denies any chest pain or shortness of breath, is most likely due to his chronic kidney disease,. - troponins mildly elevated initially, trended down 0.07>>0.06>>0.06 - EKG non acute, 2 d echo showed the ef 55-60%. Cardiology was consulted,  No work up recommended, will follow up as outpatient.  Chronic kidney disease stage IV  Appears to be at baseline, stable  follow closely as on enalapril  Hypokalemia - Repleted.   Procedures:  None  Consultations:  Cardiology   Discharge Exam: Filed Vitals:   10/25/14 0541  BP: 139/69  Pulse: 72  Temp: 97.9 F (36.6 C)  Resp: 18    General: Appear in no acute distress Cardiovascular: S1S2 RRR Respiratory:  Clear bilaterally  Discharge Instructions   Discharge Instructions    Diet - low sodium heart healthy    Complete by:  As directed      Increase activity slowly    Complete by:  As directed           Current Discharge Medication List    START taking these medications   Details  guaiFENesin (MUCINEX) 600 MG 12 hr tablet Take 2 tablets (1,200 mg total) by mouth 2 (two) times daily. Qty: 10 tablet, Refills: 0    predniSONE (DELTASONE) 10 MG tablet Prednisone 40 mg po daily x 1 day then Prednisone 30 mg po daily x 1 day then Prednisone 20 mg po daily x 1 day then Prednisone 10 mg daily x 1 day then stop... Qty: 10 tablet, Refills: 0      CONTINUE these medications which have NOT CHANGED   Details  albuterol (PROVENTIL HFA;VENTOLIN HFA) 108 (90 BASE) MCG/ACT inhaler Inhale 2 puffs into the lungs every 6 (six) hours as needed for wheezing or shortness of breath.    atorvastatin (LIPITOR) 10 MG tablet Take 10 mg by mouth daily.    clopidogrel (PLAVIX) 75 MG tablet Take 75 mg by mouth daily.    docusate sodium (COLACE) 100 MG capsule Take 100 mg by mouth daily as needed. constipation    enalapril (VASOTEC) 20 MG tablet Take 20 mg by mouth 2 (two) times daily.     furosemide (LASIX) 40 MG tablet Take 20-40 mg by mouth daily as needed for  fluid (take 1/2-1 tablet daily as needed.).     HYDROcodone-acetaminophen (NORCO) 10-325 MG per tablet Take 1 tablet by mouth 3 (three) times daily as needed. pain    levocetirizine (XYZAL) 5 MG tablet Take 5 mg by mouth every evening.    NIFEdipine (PROCARDIA-XL/ADALAT CC) 60 MG 24 hr tablet Take 60 mg by mouth 2 (two) times daily.    pantoprazole (PROTONIX) 40 MG tablet Take 40 mg by mouth 2 (two) times daily.    Tamsulosin HCl (FLOMAX) 0.4 MG CAPS Take 0.4 mg by mouth 2 (two) times daily.       STOP taking these medications     omeprazole (PRILOSEC) 40 MG capsule      azithromycin (ZITHROMAX) 250 MG tablet        Allergies   Allergen Reactions  . Morphine And Related Hives  . Penicillins Hives      The results of significant diagnostics from this hospitalization (including imaging, microbiology, ancillary and laboratory) are listed below for reference.    Significant Diagnostic Studies: Dg Chest 2 View  10/23/2014   CLINICAL DATA:  Shortness of breath with productive cough for 3 weeks. No response to antibiotic therapy. Initial encounter.  EXAM: CHEST  2 VIEW  COMPARISON:  03/27/2014 and 06/30/2013 radiographs.  FINDINGS: Mild motion on the lateral view. The heart size and mediastinal contours are stable. The lungs are clear. There is mild chronic central airway thickening, best seen on the lateral view. No pleural effusion or pneumothorax. The bones appear unremarkable. Cholecystectomy clips noted.  IMPRESSION: Stable chronic central airway thickening attributed to smoking. No acute cardiopulmonary process.   Electronically Signed   By: Richardean Sale M.D.   On: 10/23/2014 20:56    Microbiology: No results found for this or any previous visit (from the past 240 hour(s)).   Labs: Basic Metabolic Panel:  Recent Labs Lab 10/23/14 2222 10/24/14 0141 10/25/14 0555  NA 142 141 139  K 3.2* 3.2* 3.5  CL 108 108 108  CO2 24 26 24   GLUCOSE 110* 133* 95  BUN 26* 25* 15  CREATININE 2.72* 2.60* 1.92*  CALCIUM 8.4* 8.7* 8.9   Liver Function Tests:  Recent Labs Lab 10/24/14 0141  AST 17  ALT 28  ALKPHOS 113  BILITOT 0.3  PROT 7.0  ALBUMIN 4.2   No results for input(s): LIPASE, AMYLASE in the last 168 hours. No results for input(s): AMMONIA in the last 168 hours. CBC:  Recent Labs Lab 10/23/14 2222 10/24/14 0141 10/25/14 0555  WBC 6.1 6.4 4.8  NEUTROABS 3.7 3.7  --   HGB 12.9* 13.2 12.2*  HCT 38.7* 39.9 36.9*  MCV 87.8 87.9 87.9  PLT 219 221 216   Cardiac Enzymes:  Recent Labs Lab 10/23/14 2222 10/24/14 0141 10/24/14 0735 10/24/14 1306  CKTOTAL  --  239  --   --   CKMB  --  6.0*   --   --   TROPONINI 0.07* 0.06* 0.06* 0.05*   BNP: BNP (last 3 results)  Recent Labs  10/23/14 2222  BNP 22.0    ProBNP (last 3 results) No results for input(s): PROBNP in the last 8760 hours.  CBG: No results for input(s): GLUCAP in the last 168 hours.     SignedEleonore Chiquito S  [facility] 10/25/2014, 12:44 PM

## 2014-10-25 NOTE — Consult Note (Signed)
Reason for Consult: Positive troponins Referring Physician: PTH Primary care: Dr. Woody Seller Cardiolgist: New Dr. Donell Sievert Ryan Rollins is an 74 y.o. male.  HPI: This is a 74 yr old male patient with HTN, HLD, CKD stage 3, CVA, smoker(1 pack/week). Potassium 3.2, Crt 2.72, Troponin 0.05, 0.06, 0.06, 0.07. EKG NSR with PVC, nonspecific ST changes. He came in with severe cough and congestion. Chest is sore from all his coughing. Emphatically denies chest tightness pressure, shortness of breath, dizziness or presyncope. Has had 4 prior strokes and takes Plavix.      Past Medical History  Diagnosis Date  . Cerebral aneurysm   . Stroke   . Hypertension   . Hypercholesterolemia   . Prostate cancer   . CKD (chronic kidney disease) stage 3, GFR 30-59 ml/min     Past Surgical History  Procedure Laterality Date  . Cholecystectomy    . Ankle surgery    . Prostate surgery  biopsy      Family History  Problem Relation Age of Onset  . Hypertension Mother   . Diabetes Mother   . Hypertension Father   . Congestive Heart Failure Mother     Social History:  reports that he has been smoking Cigarettes.  He has a 60 pack-year smoking history. He does not have any smokeless tobacco history on file. He reports that he drinks alcohol. He reports that he does not use illicit drugs.  Allergies:  Allergies  Allergen Reactions  . Morphine And Related Hives  . Penicillins Hives    Medications:  Scheduled Meds: . atorvastatin  10 mg Oral Daily  . azithromycin  500 mg Intravenous Q24H  . benzonatate  100 mg Oral TID  . clopidogrel  75 mg Oral Daily  . enalapril  20 mg Oral BID  . enoxaparin (LOVENOX) injection  40 mg Subcutaneous Q24H  . guaiFENesin  1,200 mg Oral BID  . ipratropium-albuterol  3 mL Nebulization TID  . loratadine  10 mg Oral q1800  . mometasone-formoterol  2 puff Inhalation BID  . nicotine  21 mg Transdermal Daily  . NIFEdipine  60 mg Oral BID  . pantoprazole  40 mg Oral  Daily  . sodium chloride  3 mL Intravenous Q12H  . sodium chloride  3 mL Intravenous Q12H  . tamsulosin  0.4 mg Oral BID  . tiotropium  18 mcg Inhalation Daily   Continuous Infusions:  PRN Meds:.sodium chloride, acetaminophen **OR** acetaminophen, albuterol, docusate sodium, furosemide, HYDROcodone-acetaminophen, sodium chloride   Results for orders placed or performed during the hospital encounter of 10/23/14 (from the past 48 hour(s))  CBC with Differential/Platelet     Status: Abnormal   Collection Time: 10/23/14 10:22 PM  Result Value Ref Range   WBC 6.1 4.0 - 10.5 K/uL   RBC 4.41 4.22 - 5.81 MIL/uL   Hemoglobin 12.9 (L) 13.0 - 17.0 g/dL   HCT 38.7 (L) 39.0 - 52.0 %   MCV 87.8 78.0 - 100.0 fL   MCH 29.3 26.0 - 34.0 pg   MCHC 33.3 30.0 - 36.0 g/dL   RDW 15.2 11.5 - 15.5 %   Platelets 219 150 - 400 K/uL   Neutrophils Relative % 61 43 - 77 %   Neutro Abs 3.7 1.7 - 7.7 K/uL   Lymphocytes Relative 28 12 - 46 %   Lymphs Abs 1.7 0.7 - 4.0 K/uL   Monocytes Relative 7 3 - 12 %   Monocytes Absolute 0.5 0.1 - 1.0 K/uL  Eosinophils Relative 4 0 - 5 %   Eosinophils Absolute 0.2 0.0 - 0.7 K/uL   Basophils Relative 0 0 - 1 %   Basophils Absolute 0.0 0.0 - 0.1 K/uL  Basic metabolic panel     Status: Abnormal   Collection Time: 10/23/14 10:22 PM  Result Value Ref Range   Sodium 142 135 - 145 mmol/L   Potassium 3.2 (L) 3.5 - 5.1 mmol/L   Chloride 108 101 - 111 mmol/L   CO2 24 22 - 32 mmol/L   Glucose, Bld 110 (H) 65 - 99 mg/dL   BUN 26 (H) 6 - 20 mg/dL   Creatinine, Ser 2.72 (H) 0.61 - 1.24 mg/dL   Calcium 8.4 (L) 8.9 - 10.3 mg/dL   GFR calc non Af Amer 22 (L) >60 mL/min   GFR calc Af Amer 25 (L) >60 mL/min    Comment: (NOTE) The eGFR has been calculated using the CKD EPI equation. This calculation has not been validated in all clinical situations. eGFR's persistently <60 mL/min signify possible Chronic Kidney Disease.    Anion gap 10 5 - 15  Troponin I     Status: Abnormal    Collection Time: 10/23/14 10:22 PM  Result Value Ref Range   Troponin I 0.07 (H) <0.031 ng/mL    Comment:        PERSISTENTLY INCREASED TROPONIN VALUES IN THE RANGE OF 0.04-0.49 ng/mL CAN BE SEEN IN:       -UNSTABLE ANGINA       -CONGESTIVE HEART FAILURE       -MYOCARDITIS       -CHEST TRAUMA       -ARRYHTHMIAS       -LATE PRESENTING MYOCARDIAL INFARCTION       -COPD   CLINICAL FOLLOW-UP RECOMMENDED.   Brain natriuretic peptide     Status: None   Collection Time: 10/23/14 10:22 PM  Result Value Ref Range   B Natriuretic Peptide 22.0 0.0 - 100.0 pg/mL  Troponin I (q 6hr x 3)     Status: Abnormal   Collection Time: 10/24/14  1:41 AM  Result Value Ref Range   Troponin I 0.06 (H) <0.031 ng/mL    Comment:        PERSISTENTLY INCREASED TROPONIN VALUES IN THE RANGE OF 0.04-0.49 ng/mL CAN BE SEEN IN:       -UNSTABLE ANGINA       -CONGESTIVE HEART FAILURE       -MYOCARDITIS       -CHEST TRAUMA       -ARRYHTHMIAS       -LATE PRESENTING MYOCARDIAL INFARCTION       -COPD   CLINICAL FOLLOW-UP RECOMMENDED.   CK total and CKMB (cardiac)not at [facility]     Status: Abnormal   Collection Time: 10/24/14  1:41 AM  Result Value Ref Range   Total CK 239 49 - 397 U/L   CK, MB 6.0 (H) 0.5 - 5.0 ng/mL   Relative Index 2.5 0.0 - 2.5    Comment: Performed at [facility]  CBC with Differential/Platelet     Status: None   Collection Time: 10/24/14  1:41 AM  Result Value Ref Range   WBC 6.4 4.0 - 10.5 K/uL   RBC 4.54 4.22 - 5.81 MIL/uL   Hemoglobin 13.2 13.0 - 17.0 g/dL   HCT 39.9 39.0 - 52.0 %   MCV 87.9 78.0 - 100.0 fL   MCH 29.1 26.0 - 34.0 pg   MCHC 33.1 30.0 -  36.0 g/dL   RDW 15.3 11.5 - 15.5 %   Platelets 221 150 - 400 K/uL   Neutrophils Relative % 57 43 - 77 %   Neutro Abs 3.7 1.7 - 7.7 K/uL   Lymphocytes Relative 31 12 - 46 %   Lymphs Abs 2.0 0.7 - 4.0 K/uL   Monocytes Relative 8 3 - 12 %   Monocytes Absolute 0.5 0.1 - 1.0 K/uL   Eosinophils Relative 4 0 - 5 %    Eosinophils Absolute 0.2 0.0 - 0.7 K/uL   Basophils Relative 0 0 - 1 %   Basophils Absolute 0.0 0.0 - 0.1 K/uL  Comprehensive metabolic panel     Status: Abnormal   Collection Time: 10/24/14  1:41 AM  Result Value Ref Range   Sodium 141 135 - 145 mmol/L   Potassium 3.2 (L) 3.5 - 5.1 mmol/L   Chloride 108 101 - 111 mmol/L   CO2 26 22 - 32 mmol/L   Glucose, Bld 133 (H) 65 - 99 mg/dL   BUN 25 (H) 6 - 20 mg/dL   Creatinine, Ser 2.60 (H) 0.61 - 1.24 mg/dL   Calcium 8.7 (L) 8.9 - 10.3 mg/dL   Total Protein 7.0 6.5 - 8.1 g/dL   Albumin 4.2 3.5 - 5.0 g/dL   AST 17 15 - 41 U/L   ALT 28 17 - 63 U/L   Alkaline Phosphatase 113 38 - 126 U/L   Total Bilirubin 0.3 0.3 - 1.2 mg/dL   GFR calc non Af Amer 23 (L) >60 mL/min   GFR calc Af Amer 27 (L) >60 mL/min    Comment: (NOTE) The eGFR has been calculated using the CKD EPI equation. This calculation has not been validated in all clinical situations. eGFR's persistently <60 mL/min signify possible Chronic Kidney Disease.    Anion gap 7 5 - 15  Troponin I (q 6hr x 3)     Status: Abnormal   Collection Time: 10/24/14  7:35 AM  Result Value Ref Range   Troponin I 0.06 (H) <0.031 ng/mL    Comment:        PERSISTENTLY INCREASED TROPONIN VALUES IN THE RANGE OF 0.04-0.49 ng/mL CAN BE SEEN IN:       -UNSTABLE ANGINA       -CONGESTIVE HEART FAILURE       -MYOCARDITIS       -CHEST TRAUMA       -ARRYHTHMIAS       -LATE PRESENTING MYOCARDIAL INFARCTION       -COPD   CLINICAL FOLLOW-UP RECOMMENDED.   Troponin I (q 6hr x 3)     Status: Abnormal   Collection Time: 10/24/14  1:06 PM  Result Value Ref Range   Troponin I 0.05 (H) <0.031 ng/mL    Comment:        PERSISTENTLY INCREASED TROPONIN VALUES IN THE RANGE OF 0.04-0.49 ng/mL CAN BE SEEN IN:       -UNSTABLE ANGINA       -CONGESTIVE HEART FAILURE       -MYOCARDITIS       -CHEST TRAUMA       -ARRYHTHMIAS       -LATE PRESENTING MYOCARDIAL INFARCTION       -COPD   CLINICAL FOLLOW-UP  RECOMMENDED.   CBC     Status: Abnormal   Collection Time: 10/25/14  5:55 AM  Result Value Ref Range   WBC 4.8 4.0 - 10.5 K/uL   RBC 4.20 (L) 4.22 - 5.81 MIL/uL  Hemoglobin 12.2 (L) 13.0 - 17.0 g/dL   HCT 36.9 (L) 39.0 - 52.0 %   MCV 87.9 78.0 - 100.0 fL   MCH 29.0 26.0 - 34.0 pg   MCHC 33.1 30.0 - 36.0 g/dL   RDW 15.2 11.5 - 15.5 %   Platelets 216 150 - 400 K/uL  Basic metabolic panel     Status: Abnormal   Collection Time: 10/25/14  5:55 AM  Result Value Ref Range   Sodium 139 135 - 145 mmol/L   Potassium 3.5 3.5 - 5.1 mmol/L   Chloride 108 101 - 111 mmol/L   CO2 24 22 - 32 mmol/L   Glucose, Bld 95 65 - 99 mg/dL   BUN 15 6 - 20 mg/dL   Creatinine, Ser 1.92 (H) 0.61 - 1.24 mg/dL   Calcium 8.9 8.9 - 10.3 mg/dL   GFR calc non Af Amer 33 (L) >60 mL/min   GFR calc Af Amer 39 (L) >60 mL/min    Comment: (NOTE) The eGFR has been calculated using the CKD EPI equation. This calculation has not been validated in all clinical situations. eGFR's persistently <60 mL/min signify possible Chronic Kidney Disease.    Anion gap 7 5 - 15    Dg Chest 2 View  10/23/2014   CLINICAL DATA:  Shortness of breath with productive cough for 3 weeks. No response to antibiotic therapy. Initial encounter.  EXAM: CHEST  2 VIEW  COMPARISON:  03/27/2014 and 06/30/2013 radiographs.  FINDINGS: Mild motion on the lateral view. The heart size and mediastinal contours are stable. The lungs are clear. There is mild chronic central airway thickening, best seen on the lateral view. No pleural effusion or pneumothorax. The bones appear unremarkable. Cholecystectomy clips noted.  IMPRESSION: Stable chronic central airway thickening attributed to smoking. No acute cardiopulmonary process.   Electronically Signed   By: Richardean Sale M.D.   On: 10/23/2014 20:56    ROS  See HPI Eyes: Negative Ears:Negative for hearing loss, tinnitus Cardiovascular: Negative for chest pain, palpitations,irregular heartbeat, dyspnea,  dyspnea on exertion, near-syncope, orthopnea, paroxysmal nocturnal dyspnea and syncope,edema, claudication, cyanosis,.  Respiratory:   Negative for  hemoptysis, shortness of breath, sleep disturbances due to breathing, sputum production and wheezing.   Endocrine: Negative for cold intolerance and heat intolerance.  Hematologic/Lymphatic: Negative for adenopathy and bleeding problem. Does not bruise/bleed easily.  Musculoskeletal: Negative.   Gastrointestinal: Negative for nausea, vomiting, reflux, abdominal pain, diarrhea, constipation.   Genitourinary: Negative for bladder incontinence, dysuria, flank pain, frequency, hematuria, hesitancy, nocturia and urgency.  Neurological: Negative.  Allergic/Immunologic: Negative for environmental allergies.  Blood pressure 139/69, pulse 72, temperature 97.9 F (36.6 C), temperature source Oral, resp. rate 18, height 6' 3"  (1.905 m), weight 310 lb (140.615 kg), SpO2 98 %. Physical Exam PHYSICAL EXAM: Obese, in no acute distress. Neck: No JVD, HJR, Bruit, or thyroid enlargement Lungs: Decreased breath sounds with No tachypnea, clear without wheezing, rales, or rhonchi Cardiovascular: RRR, PMI not displaced, heart sounds normal, S4, no bruit, thrill, or heave. Abdomen: BS normal. Soft without organomegaly, masses, lesions or tenderness. Extremities: without cyanosis, clubbing or edema. Good distal pulses bilateral SKin: Warm, no lesions or rashes  Musculoskeletal: No deformities Neuro: no focal signs  2Decho 10/24/14 Study Conclusions  - Left ventricle: The cavity size was normal. Wall thickness was   increased in a pattern of moderate LVH. Systolic function was   normal. The estimated ejection fraction was in the range of 55%   to 60%. Wall  motion was normal; there were no regional wall   motion abnormalities. Left ventricular diastolic function   parameters were normal. - Aortic valve: Valve area (VTI): 4.11 cm^2. Valve area (Vmax):   3.78  cm^2. - Mitral valve: There was mild regurgitation. - Technically adequate study.    Assessment/Plan: Positive troponins but no cardiac symptoms, EKG changes and normal LV function on echo. Suspect secondary to CKD. Doubt further work up needed at this time.  Cough: on Zithromax  HTN: not always well controlled as outpatient on vasotec procardia and lasix   HLD on Lipitor  CVA x 4 and aneurysm on Plavix  Tobacco abuse; smoking cessation advised.  Ermalinda Barrios 10/25/2014, 10:04 AM   Patient seen and discussed with PA Bonnell Public, I agree with her documentation above. 74 yo male hx of HTN, HL, CVA, prostate CA admitted with cough and SOB. Cardiology consulted for mild troponin elevation, patient denies any symptoms.    BNP 22, K 3.2, Cr 2.72 (baseline 2.6-2.7), Hgb 12.9, Plt 219,  Trop 0.07--> 0.06-->0.05-->0.05.  EKG SR, no acute ischemic changes Echo LVEF 55-60%, mod LVH, mild MR CXR stable smoking changes  Mild nonspecific troponin elevation in the setting of CKD in the absence of cardiac symptoms, EKG changes, or abnormal echo findings. Patient also with bronchtiis, likely some increased demand, if viral can lead to myocardial inflammation. He is statin, plavix (presume for his CVA), ACE-I. Would continue medical therapy, can f/u with [place] as outpatient in 3-4 weeks to reevalaute. Do not see indication for ischemic testing at this time. Will sign off inpatient care.    Zandra Abts MD

## 2014-10-25 NOTE — Progress Notes (Signed)
Ryan Rollins discharged Home per MD order.  Discharge instructions reviewed and discussed with the patient, all questions and concerns answered. Copy of instructions and scripts given to patient.    Medication List    STOP taking these medications        azithromycin 250 MG tablet  Commonly known as:  ZITHROMAX      TAKE these medications        albuterol 108 (90 BASE) MCG/ACT inhaler  Commonly known as:  PROVENTIL HFA;VENTOLIN HFA  Inhale 2 puffs into the lungs every 6 (six) hours as needed for wheezing or shortness of breath.     atorvastatin 10 MG tablet  Commonly known as:  LIPITOR  Take 10 mg by mouth daily.     clopidogrel 75 MG tablet  Commonly known as:  PLAVIX  Take 75 mg by mouth daily.     docusate sodium 100 MG capsule  Commonly known as:  COLACE  Take 100 mg by mouth daily as needed. constipation     enalapril 20 MG tablet  Commonly known as:  VASOTEC  Take 20 mg by mouth 2 (two) times daily.     furosemide 40 MG tablet  Commonly known as:  LASIX  Take 20-40 mg by mouth daily as needed for fluid (take 1/2-1 tablet daily as needed.).     guaiFENesin 600 MG 12 hr tablet  Commonly known as:  MUCINEX  Take 2 tablets (1,200 mg total) by mouth 2 (two) times daily.     HYDROcodone-acetaminophen 10-325 MG per tablet  Commonly known as:  NORCO  Take 1 tablet by mouth 3 (three) times daily as needed. pain     levocetirizine 5 MG tablet  Commonly known as:  XYZAL  Take 5 mg by mouth every evening.     NIFEdipine 60 MG 24 hr tablet  Commonly known as:  PROCARDIA-XL/ADALAT CC  Take 60 mg by mouth 2 (two) times daily.     pantoprazole 40 MG tablet  Commonly known as:  PROTONIX  Take 40 mg by mouth 2 (two) times daily.     predniSONE 10 MG tablet  Commonly known as:  DELTASONE  Prednisone 40 mg po daily x 1 day then Prednisone 30 mg po daily x 1 day then Prednisone 20 mg po daily x 1 day then Prednisone 10 mg daily x 1 day then stop...     tamsulosin 0.4  MG Caps capsule  Commonly known as:  FLOMAX  Take 0.4 mg by mouth 2 (two) times daily.        Patients skin is clean, dry and intact, no evidence of skin break down. IV site discontinued and catheter remains intact. Site without signs and symptoms of complications. Dressing and pressure applied.  Patient escorted to car by NT in a wheelchair,  no distress noted upon discharge.  [name] 10/25/2014 2:50 PM2

## 2014-10-25 NOTE — Care Management Note (Signed)
Case Management Note  Patient Details  Name: BRYCEN BEAN MRN: [id number] Date of Birth: 06-Aug-1940  Subjective/Objective:                    Action/Plan:   Expected Discharge Date:  10/26/14               Expected Discharge Plan:  Home/Self Care  In-House Referral:  NA  Discharge planning Services  CM Consult  Post Acute Care Choice:  NA Choice offered to:  NA  DME Arranged:    DME Agency:     HH Arranged:    [place] Agency:     Status of Service:  Completed, signed off  Medicare Important Message Given:    Date Medicare IM Given:    Medicare IM give by:    Date Additional Medicare IM Given:    Additional Medicare Important Message give by:     If discussed at [place] of Stay Meetings, dates discussed:    Additional Comments: Pt discharged home today. No Cm needs noted. Christinia Gully Nelson, RN 10/25/2014, 12:57 PM

## 2014-11-20 ENCOUNTER — Encounter (HOSPITAL_COMMUNITY): Payer: Self-pay | Admitting: Emergency Medicine

## 2014-11-20 ENCOUNTER — Emergency Department (HOSPITAL_COMMUNITY): Payer: Medicare Other

## 2014-11-20 ENCOUNTER — Observation Stay (HOSPITAL_COMMUNITY)
Admission: EM | Admit: 2014-11-20 | Discharge: 2014-11-21 | Disposition: A | Discharge status: Home / Self Care | Payer: Medicare Other | Attending: Family Medicine | Admitting: Family Medicine

## 2014-11-20 DIAGNOSIS — R079 Chest pain, unspecified: Secondary | ICD-10-CM | POA: Diagnosis present

## 2014-11-20 DIAGNOSIS — N183 Chronic kidney disease, stage 3 (moderate): Secondary | ICD-10-CM

## 2014-11-20 DIAGNOSIS — I679 Cerebrovascular disease, unspecified: Secondary | ICD-10-CM | POA: Diagnosis not present

## 2014-11-20 DIAGNOSIS — Z8673 Personal history of transient ischemic attack (TIA), and cerebral infarction without residual deficits: Secondary | ICD-10-CM | POA: Diagnosis not present

## 2014-11-20 DIAGNOSIS — I10 Essential (primary) hypertension: Secondary | ICD-10-CM | POA: Diagnosis present

## 2014-11-20 DIAGNOSIS — Z79899 Other long term (current) drug therapy: Secondary | ICD-10-CM | POA: Insufficient documentation

## 2014-11-20 DIAGNOSIS — R072 Precordial pain: Secondary | ICD-10-CM | POA: Diagnosis not present

## 2014-11-20 DIAGNOSIS — Z7902 Long term (current) use of antithrombotics/antiplatelets: Secondary | ICD-10-CM | POA: Insufficient documentation

## 2014-11-20 DIAGNOSIS — I129 Hypertensive chronic kidney disease with stage 1 through stage 4 chronic kidney disease, or unspecified chronic kidney disease: Secondary | ICD-10-CM | POA: Insufficient documentation

## 2014-11-20 DIAGNOSIS — Z880 Allergy status to penicillin: Secondary | ICD-10-CM | POA: Diagnosis not present

## 2014-11-20 DIAGNOSIS — E780 Pure hypercholesterolemia: Secondary | ICD-10-CM | POA: Diagnosis not present

## 2014-11-20 DIAGNOSIS — Z720 Tobacco use: Secondary | ICD-10-CM | POA: Diagnosis not present

## 2014-11-20 DIAGNOSIS — N184 Chronic kidney disease, stage 4 (severe): Secondary | ICD-10-CM | POA: Diagnosis present

## 2014-11-20 DIAGNOSIS — Z8546 Personal history of malignant neoplasm of prostate: Secondary | ICD-10-CM | POA: Insufficient documentation

## 2014-11-20 HISTORY — DX: Cerebrovascular disease, unspecified: I67.9

## 2014-11-20 LAB — I-STAT TROPONIN, ED: TROPONIN I, POC: 0.04 ng/mL (ref 0.00–0.08)

## 2014-11-20 LAB — COMPREHENSIVE METABOLIC PANEL
ALK PHOS: 103 U/L (ref 38–126)
ALT: 19 U/L (ref 17–63)
ANION GAP: 10 (ref 5–15)
AST: 21 U/L (ref 15–41)
Albumin: 4.2 g/dL (ref 3.5–5.0)
BUN: 20 mg/dL (ref 6–20)
CALCIUM: 9.3 mg/dL (ref 8.9–10.3)
CHLORIDE: 105 mmol/L (ref 101–111)
CO2: 23 mmol/L (ref 22–32)
CREATININE: 2.84 mg/dL — AB (ref 0.61–1.24)
GFR calc Af Amer: 24 mL/min — ABNORMAL LOW (ref >60)
GFR calc non Af Amer: 21 mL/min — ABNORMAL LOW (ref >60)
Glucose, Bld: 86 mg/dL (ref 65–99)
POTASSIUM: 3.8 mmol/L (ref 3.5–5.1)
Sodium: 138 mmol/L (ref 135–145)
Total Bilirubin: 0.5 mg/dL (ref 0.3–1.2)
Total Protein: 7.1 g/dL (ref 6.5–8.1)

## 2014-11-20 LAB — CBC WITH DIFFERENTIAL/PLATELET
Basophils Absolute: 0 10*3/uL (ref 0.0–0.1)
Basophils Relative: 1 % (ref 0–1)
Eosinophils Absolute: 0.2 10*3/uL (ref 0.0–0.7)
Eosinophils Relative: 3 % (ref 0–5)
HEMATOCRIT: 41.5 % (ref 39.0–52.0)
Hemoglobin: 13.8 g/dL (ref 13.0–17.0)
LYMPHS ABS: 1.5 10*3/uL (ref 0.7–4.0)
Lymphocytes Relative: 23 % (ref 12–46)
MCH: 29.4 pg (ref 26.0–34.0)
MCHC: 33.3 g/dL (ref 30.0–36.0)
MCV: 88.5 fL (ref 78.0–100.0)
MONOS PCT: 8 % (ref 3–12)
Monocytes Absolute: 0.5 10*3/uL (ref 0.1–1.0)
Neutro Abs: 4.2 10*3/uL (ref 1.7–7.7)
Neutrophils Relative %: 65 % (ref 43–77)
PLATELETS: 229 10*3/uL (ref 150–400)
RBC: 4.69 MIL/uL (ref 4.22–5.81)
RDW: 15 % (ref 11.5–15.5)
WBC: 6.4 10*3/uL (ref 4.0–10.5)

## 2014-11-20 LAB — URINALYSIS, ROUTINE W REFLEX MICROSCOPIC
BILIRUBIN URINE: NEGATIVE
GLUCOSE, UA: NEGATIVE mg/dL
Hgb urine dipstick: NEGATIVE
KETONES UR: NEGATIVE mg/dL
Leukocytes, UA: NEGATIVE
NITRITE: NEGATIVE
Protein, ur: NEGATIVE mg/dL
Specific Gravity, Urine: 1.02 (ref 1.005–1.030)
Urobilinogen, UA: 0.2 mg/dL (ref 0.0–1.0)
pH: 5.5 (ref 5.0–8.0)

## 2014-11-20 LAB — TROPONIN I
TROPONIN I: 0.05 ng/mL — AB (ref <0.031)
Troponin I: 0.05 ng/mL — ABNORMAL HIGH (ref <0.031)

## 2014-11-20 MED ORDER — LORATADINE 10 MG PO TABS: 10.0000 mg | ORAL_TABLET | Freq: Every day | ORAL | Status: DC | PRN

## 2014-11-20 MED ORDER — HYDROCODONE-ACETAMINOPHEN 10-325 MG PO TABS: 1.0000 | ORAL_TABLET | Freq: Three times a day (TID) | ORAL | Status: DC | PRN

## 2014-11-20 MED ORDER — ASPIRIN 81 MG PO CHEW
324.0000 mg | CHEWABLE_TABLET | Freq: Once | ORAL | Status: AC
  Administered 2014-11-20: 324 mg via ORAL
  Filled 2014-11-20: qty 4

## 2014-11-20 MED ORDER — ALBUTEROL SULFATE (2.5 MG/3ML) 0.083% IN NEBU: 3.0000 mL | INHALATION_SOLUTION | Freq: Four times a day (QID) | RESPIRATORY_TRACT | Status: DC | PRN

## 2014-11-20 MED ORDER — TAMSULOSIN HCL 0.4 MG PO CAPS
0.4000 mg | ORAL_CAPSULE | Freq: Two times a day (BID) | ORAL | Status: DC
  Administered 2014-11-20 – 2014-11-21 (×2): 0.4 mg via ORAL
  Filled 2014-11-20 (×2): qty 1

## 2014-11-20 MED ORDER — ENALAPRIL MALEATE 5 MG PO TABS
20.0000 mg | ORAL_TABLET | Freq: Two times a day (BID) | ORAL | Status: DC
  Administered 2014-11-20 – 2014-11-21 (×2): 20 mg via ORAL
  Filled 2014-11-20 (×2): qty 1
  Filled 2014-11-20: qty 4
  Filled 2014-11-20: qty 1
  Filled 2014-11-20: qty 4
  Filled 2014-11-20: qty 1

## 2014-11-20 MED ORDER — PANTOPRAZOLE SODIUM 40 MG PO TBEC
40.0000 mg | DELAYED_RELEASE_TABLET | Freq: Two times a day (BID) | ORAL | Status: DC
  Administered 2014-11-20 – 2014-11-21 (×2): 40 mg via ORAL
  Filled 2014-11-20 (×2): qty 1

## 2014-11-20 MED ORDER — LEVOCETIRIZINE DIHYDROCHLORIDE 5 MG PO TABS: 5.0000 mg | ORAL_TABLET | Freq: Every evening | ORAL | Status: DC

## 2014-11-20 MED ORDER — ACETAMINOPHEN 325 MG PO TABS: 650.0000 mg | ORAL_TABLET | ORAL | Status: DC | PRN

## 2014-11-20 MED ORDER — ENOXAPARIN SODIUM 30 MG/0.3ML ~~LOC~~ SOLN
30.0000 mg | SUBCUTANEOUS | Status: DC
  Administered 2014-11-20: 30 mg via SUBCUTANEOUS
  Filled 2014-11-20: qty 0.3

## 2014-11-20 MED ORDER — CLOPIDOGREL BISULFATE 75 MG PO TABS
75.0000 mg | ORAL_TABLET | Freq: Every day | ORAL | Status: DC
  Administered 2014-11-21: 75 mg via ORAL
  Filled 2014-11-20 (×2): qty 1

## 2014-11-20 MED ORDER — SODIUM CHLORIDE 0.9 % IV BOLUS (SEPSIS)
250.0000 mL | Freq: Once | INTRAVENOUS | Status: AC
Start: 2014-11-20 — End: 2014-11-20
  Administered 2014-11-20: 250 mL via INTRAVENOUS

## 2014-11-20 MED ORDER — ENOXAPARIN SODIUM 40 MG/0.4ML ~~LOC~~ SOLN: 40.0000 mg | SUBCUTANEOUS | Status: DC

## 2014-11-20 MED ORDER — BISACODYL 5 MG PO TBEC
5.0000 mg | DELAYED_RELEASE_TABLET | ORAL | Status: DC
  Administered 2014-11-21: 5 mg via ORAL
  Filled 2014-11-20 (×2): qty 1

## 2014-11-20 MED ORDER — ONDANSETRON HCL 4 MG/2ML IJ SOLN: 4.0000 mg | Freq: Four times a day (QID) | INTRAMUSCULAR | Status: DC | PRN

## 2014-11-20 MED ORDER — NIFEDIPINE ER OSMOTIC RELEASE 30 MG PO TB24
60.0000 mg | ORAL_TABLET | Freq: Two times a day (BID) | ORAL | Status: DC
  Administered 2014-11-20 – 2014-11-21 (×2): 60 mg via ORAL
  Filled 2014-11-20 (×2): qty 2

## 2014-11-20 MED ORDER — ALBUTEROL SULFATE HFA 108 (90 BASE) MCG/ACT IN AERS
2.0000 | INHALATION_SPRAY | Freq: Four times a day (QID) | RESPIRATORY_TRACT | Status: DC | PRN
  Filled 2014-11-20: qty 6.7

## 2014-11-20 MED ORDER — SODIUM CHLORIDE 0.9 % IV SOLN
INTRAVENOUS | Status: DC
  Administered 2014-11-20: 16:00:00 via INTRAVENOUS

## 2014-11-20 MED ORDER — GUAIFENESIN ER 600 MG PO TB12
600.0000 mg | ORAL_TABLET | Freq: Every day | ORAL | Status: DC | PRN
  Filled 2014-11-20: qty 2

## 2014-11-20 NOTE — H&P (Signed)
[facility] History and Physical  Ryan Rollins:[id number] DOB: Aug 25, 1940 DOA: 11/20/2014  Referring physician: ER PCP: Glenda Chroman., MD   Chief Complaint: Chest pain  HPI: Ryan Rollins is a 74 y.o. male  This is a 74 year old man, with a history of hypertension and cerebrovascular disease, who presents with chest pain which began at approximately 2 PM today of sudden onset. He said it was a pressure sensation and somewhat radiated to his left arm. It was associated with dyspnea but not sweating or nausea. He rated the pain as 8/10 and after coming to the emergency room 30-45 minutes later, the pain seems to improve. He was given aspirin in the emergency room. The patient has had approximately 4 strokes over the years, the last one I believe in 2001. Thankfully, he is seems to have made good recovery from all his strokes. Initial evaluation emergency room shows normal electrocardiogram and negative troponin level. In view of his risk factors and nature of his pain, he is now being admitted for further investigation.   Review of Systems:  Apart from symptoms above, all systems negative.  Past Medical History  Diagnosis Date  . Cerebral aneurysm   . Stroke   . Hypertension   . Hypercholesterolemia   . Prostate cancer   . CKD (chronic kidney disease) stage 3, GFR 30-59 ml/min   . Cerebrovascular disease 11/20/2014   Past Surgical History  Procedure Laterality Date  . Cholecystectomy    . Ankle surgery    . Prostate surgery  biopsy     Social History:  reports that he has been smoking Cigarettes.  He has a 60 pack-year smoking history. He does not have any smokeless tobacco history on file. He reports that he drinks alcohol. He reports that he does not use illicit drugs.  Allergies  Allergen Reactions  . Morphine And Related Hives  . Penicillins Hives    Family History  Problem Relation Age of Onset  . Hypertension Mother   . Diabetes Mother   . Hypertension  Father   . Congestive Heart Failure Mother      Prior to Admission medications   Medication Sig Start Date End Date Taking? Authorizing Provider  albuterol (PROVENTIL HFA;VENTOLIN HFA) 108 (90 BASE) MCG/ACT inhaler Inhale 2 puffs into the lungs every 6 (six) hours as needed for wheezing or shortness of breath.   Yes Historical Provider, MD  bisacodyl (LAXATIVE) 5 MG EC tablet Take 5 mg by mouth every other day.   Yes Historical Provider, MD  clopidogrel (PLAVIX) 75 MG tablet Take 75 mg by mouth daily.   Yes Historical Provider, MD  enalapril (VASOTEC) 20 MG tablet Take 20 mg by mouth 2 (two) times daily.    Yes Historical Provider, MD  furosemide (LASIX) 40 MG tablet Take 20-40 mg by mouth daily as needed for fluid (take 1/2-1 tablet daily as needed.).    Yes Historical Provider, MD  guaiFENesin (MUCINEX) 600 MG 12 hr tablet Take 2 tablets (1,200 mg total) by mouth 2 (two) times daily. Patient taking differently: Take 600-1,200 mg by mouth daily as needed for cough or to loosen phlegm.  10/25/14  Yes Oswald Hillock, MD  HYDROcodone-acetaminophen (NORCO) 10-325 MG per tablet Take 1 tablet by mouth 3 (three) times daily as needed. pain   Yes Historical Provider, MD  levocetirizine (XYZAL) 5 MG tablet Take 5 mg by mouth every evening.   Yes Historical Provider, MD  NIFEdipine (PROCARDIA-XL/ADALAT CC) 60 MG 24  hr tablet Take 60 mg by mouth 2 (two) times daily.   Yes Historical Provider, MD  pantoprazole (PROTONIX) 40 MG tablet Take 40 mg by mouth 2 (two) times daily.   Yes Historical Provider, MD  Tamsulosin HCl (FLOMAX) 0.4 MG CAPS Take 0.4 mg by mouth 2 (two) times daily.    Yes Historical Provider, MD   Physical Exam: Filed Vitals:   11/20/14 1437 11/20/14 1747  BP: 134/74 114/62  Pulse: 71 69  Temp: 97.6 F (36.4 C) 97.5 F (36.4 C)  TempSrc: Oral Oral  Resp: 20 17  Height: 6' (1.829 m)   Weight: 145.151 kg (320 lb)   SpO2: 96% 99%    Wt Readings from Last 3 Encounters:  11/20/14  145.151 kg (320 lb)  10/25/14 140.615 kg (310 lb)  03/27/14 142.883 kg (315 lb)    General:  Appears calm and comfortable. He does not appear to be in pain at the present time. Obese. Eyes: PERRL, normal lids, irises & conjunctiva ENT: grossly normal hearing, lips & tongue Neck: no LAD, masses or thyromegaly Cardiovascular: RRR, no m/r/g. No LE edema. Telemetry: SR, no arrhythmias  Respiratory: CTA bilaterally, no w/r/r. Normal respiratory effort. Abdomen: soft, ntnd Skin: no rash or induration seen on limited exam Musculoskeletal: grossly normal tone BUE/BLE Psychiatric: grossly normal mood and affect, speech fluent and appropriate Neurologic: grossly non-focal.          Labs on Admission:  Basic Metabolic Panel:  Recent Labs Lab 11/20/14 1633  NA 138  K 3.8  CL 105  CO2 23  GLUCOSE 86  BUN 20  CREATININE 2.84*  CALCIUM 9.3   Liver Function Tests:  Recent Labs Lab 11/20/14 1633  AST 21  ALT 19  ALKPHOS 103  BILITOT 0.5  PROT 7.1  ALBUMIN 4.2   No results for input(s): LIPASE, AMYLASE in the last 168 hours. No results for input(s): AMMONIA in the last 168 hours. CBC:  Recent Labs Lab 11/20/14 1633  WBC 6.4  NEUTROABS 4.2  HGB 13.8  HCT 41.5  MCV 88.5  PLT 229   Cardiac Enzymes: No results for input(s): CKTOTAL, CKMB, CKMBINDEX, TROPONINI in the last 168 hours.  BNP (last 3 results)  Recent Labs  10/23/14 2222  BNP 22.0    ProBNP (last 3 results) No results for input(s): PROBNP in the last 8760 hours.  CBG: No results for input(s): GLUCAP in the last 168 hours.  Radiological Exams on Admission: Dg Chest 2 View  11/20/2014   CLINICAL DATA:  74 year old male with chest pain (central chest heaviness accompanied by shortness of breath, dizziness and nausea)  EXAM: CHEST  2 VIEW  COMPARISON:  Prior chest x-ray 10/23/2014  FINDINGS: The lungs are clear and negative for focal airspace consolidation, pulmonary edema or suspicious pulmonary nodule.  No pleural effusion or pneumothorax. Cardiac and mediastinal contours are within normal limits. Atherosclerotic calcification in the transverse aorta. No acute fracture or lytic or blastic osseous lesions. The visualized upper abdominal bowel gas pattern is unremarkable.  IMPRESSION: No active cardiopulmonary disease.  Aortic atherosclerosis.   Electronically Signed   By: Jacqulynn Cadet M.D.   On: 11/20/2014 16:54    EKG: Independently reviewed. Normal sinus rhythm without any acute ST-T wave changes.  Assessment/Plan   1. Chest pain. The nature of the pain and description is worrisome for cardiac pain. We will cycle cardiac enzymes. I will request cardiology consultation. 2. Hypertension. Stable. 3. Chronic kidney disease. Creatinine is more elevated  than usual, he will be given IV fluids. Monitor renal function closely. 4. Cerebrovascular disease. Stable.  Further recommendations will depend on patient's hospital progress.  Code Status: Full code.   DVT Prophylaxis: Lovenox.  Family Communication: I discussed the plan with the patient at the bedside.   Disposition Plan: Home when medically stable.   Time spent: 45 minutes.  Doree Albee [facility] Pager [phone].

## 2014-11-20 NOTE — ED Provider Notes (Addendum)
CSN: [id number]     Arrival date & time 11/20/14  1429 History   First MD Initiated Contact with Patient 11/20/14 1508     Chief Complaint  Patient presents with  . Chest Pain     (Consider location/radiation/quality/duration/timing/severity/associated sxs/prior Treatment) Patient is a 74 y.o. male presenting with chest pain. The history is provided by the patient.  Chest Pain Associated symptoms: shortness of breath   Associated symptoms: no abdominal pain, no back pain, no fever, no headache, no nausea and not vomiting    Patient with onset of chest pain at 2:00's afternoon described as a pressure did radiate to left arm described as a pain that was 8 out of 10. Upon arrival here stated that it was 7 out of 10. His midsternal area associated with some mild shortness of breath no nausea no vomiting no history past history of similar pain. Patient is already on Plavix. Patient's had multiple strokes in the past. Patient did not take any additional medicines at home for the pain.  Past Medical History  Diagnosis Date  . Cerebral aneurysm   . Stroke   . Hypertension   . Hypercholesterolemia   . Prostate cancer   . CKD (chronic kidney disease) stage 3, GFR 30-59 ml/min    Past Surgical History  Procedure Laterality Date  . Cholecystectomy    . Ankle surgery    . Prostate surgery  biopsy     Family History  Problem Relation Age of Onset  . Hypertension Mother   . Diabetes Mother   . Hypertension Father   . Congestive Heart Failure Mother    History  Substance Use Topics  . Smoking status: Current Every Day Smoker -- 1.00 packs/day for 60 years    Types: Cigarettes  . Smokeless tobacco: Not on file  . Alcohol Use: 0.0 oz/week    0 Standard drinks or equivalent per week     Comment: occ    Review of Systems  Constitutional: Negative for fever.  HENT: Negative for congestion.   Eyes: Negative for redness.  Respiratory: Positive for shortness of breath.   Cardiovascular:  Positive for chest pain.  Gastrointestinal: Negative for nausea, vomiting and abdominal pain.  Genitourinary: Negative for dysuria.  Musculoskeletal: Negative for back pain.  Skin: Negative for rash.  Neurological: Negative for syncope and headaches.  Psychiatric/Behavioral: Negative for confusion.      Allergies  Morphine and related and Penicillins  Home Medications   Prior to Admission medications   Medication Sig Start Date End Date Taking? Authorizing Provider  albuterol (PROVENTIL HFA;VENTOLIN HFA) 108 (90 BASE) MCG/ACT inhaler Inhale 2 puffs into the lungs every 6 (six) hours as needed for wheezing or shortness of breath.   Yes Historical Provider, MD  bisacodyl (LAXATIVE) 5 MG EC tablet Take 5 mg by mouth every other day.   Yes Historical Provider, MD  clopidogrel (PLAVIX) 75 MG tablet Take 75 mg by mouth daily.   Yes Historical Provider, MD  enalapril (VASOTEC) 20 MG tablet Take 20 mg by mouth 2 (two) times daily.    Yes Historical Provider, MD  furosemide (LASIX) 40 MG tablet Take 20-40 mg by mouth daily as needed for fluid (take 1/2-1 tablet daily as needed.).    Yes Historical Provider, MD  guaiFENesin (MUCINEX) 600 MG 12 hr tablet Take 2 tablets (1,200 mg total) by mouth 2 (two) times daily. Patient taking differently: Take 600-1,200 mg by mouth daily as needed for cough or to loosen phlegm.  10/25/14  Yes Oswald Hillock, MD  HYDROcodone-acetaminophen (NORCO) 10-325 MG per tablet Take 1 tablet by mouth 3 (three) times daily as needed. pain   Yes Historical Provider, MD  levocetirizine (XYZAL) 5 MG tablet Take 5 mg by mouth every evening.   Yes Historical Provider, MD  NIFEdipine (PROCARDIA-XL/ADALAT CC) 60 MG 24 hr tablet Take 60 mg by mouth 2 (two) times daily.   Yes Historical Provider, MD  pantoprazole (PROTONIX) 40 MG tablet Take 40 mg by mouth 2 (two) times daily.   Yes Historical Provider, MD  Tamsulosin HCl (FLOMAX) 0.4 MG CAPS Take 0.4 mg by mouth 2 (two) times daily.     Yes Historical Provider, MD   BP 134/74 mmHg  Pulse 71  Temp(Src) 97.6 F (36.4 C) (Oral)  Resp 20  Ht 6' (1.829 m)  Wt 320 lb (145.151 kg)  BMI 43.39 kg/m2  SpO2 96% Physical Exam  Constitutional: He is oriented to person, place, and time. He appears well-developed and well-nourished. No distress.  HENT:  Head: Normocephalic and atraumatic.  Mouth/Throat: Oropharynx is clear and moist.  Eyes: Conjunctivae and EOM are normal. Pupils are equal, round, and reactive to light.  Neck: Normal range of motion.  Cardiovascular: Normal rate and regular rhythm.   Pulmonary/Chest: Effort normal and breath sounds normal. No respiratory distress. He exhibits no tenderness.  Abdominal: Soft. Bowel sounds are normal. There is no tenderness.  Neurological: He is alert and oriented to person, place, and time.  Skin: Skin is warm. No rash noted.  Nursing note and vitals reviewed.   ED Course  Procedures (including critical care time) Labs Review Labs Reviewed  CBC WITH DIFFERENTIAL/PLATELET  URINALYSIS, ROUTINE W REFLEX MICROSCOPIC (NOT AT [facility])  COMPREHENSIVE METABOLIC PANEL  I-STAT TROPOININ, ED   Results for orders placed or performed during the hospital encounter of 11/20/14  CBC with Differential/Platelet  Result Value Ref Range   WBC 6.4 4.0 - 10.5 K/uL   RBC 4.69 4.22 - 5.81 MIL/uL   Hemoglobin 13.8 13.0 - 17.0 g/dL   HCT 41.5 39.0 - 52.0 %   MCV 88.5 78.0 - 100.0 fL   MCH 29.4 26.0 - 34.0 pg   MCHC 33.3 30.0 - 36.0 g/dL   RDW 15.0 11.5 - 15.5 %   Platelets 229 150 - 400 K/uL   Neutrophils Relative % 65 43 - 77 %   Neutro Abs 4.2 1.7 - 7.7 K/uL   Lymphocytes Relative 23 12 - 46 %   Lymphs Abs 1.5 0.7 - 4.0 K/uL   Monocytes Relative 8 3 - 12 %   Monocytes Absolute 0.5 0.1 - 1.0 K/uL   Eosinophils Relative 3 0 - 5 %   Eosinophils Absolute 0.2 0.0 - 0.7 K/uL   Basophils Relative 1 0 - 1 %   Basophils Absolute 0.0 0.0 - 0.1 K/uL  Urinalysis, Routine w reflex microscopic  (not at [facility])  Result Value Ref Range   Color, Urine YELLOW YELLOW   APPearance CLEAR CLEAR   Specific Gravity, Urine 1.020 1.005 - 1.030   pH 5.5 5.0 - 8.0   Glucose, UA NEGATIVE NEGATIVE mg/dL   Hgb urine dipstick NEGATIVE NEGATIVE   Bilirubin Urine NEGATIVE NEGATIVE   Ketones, ur NEGATIVE NEGATIVE mg/dL   Protein, ur NEGATIVE NEGATIVE mg/dL   Urobilinogen, UA 0.2 0.0 - 1.0 mg/dL   Nitrite NEGATIVE NEGATIVE   Leukocytes, UA NEGATIVE NEGATIVE  I-Stat Troponin, ED (not at [facility])  Result Value Ref Range  Troponin i, poc 0.04 0.00 - 0.08 ng/mL   Comment 3             Imaging Review Dg Chest 2 View  11/20/2014   CLINICAL DATA:  74 year old male with chest pain (central chest heaviness accompanied by shortness of breath, dizziness and nausea)  EXAM: CHEST  2 VIEW  COMPARISON:  Prior chest x-ray 10/23/2014  FINDINGS: The lungs are clear and negative for focal airspace consolidation, pulmonary edema or suspicious pulmonary nodule. No pleural effusion or pneumothorax. Cardiac and mediastinal contours are within normal limits. Atherosclerotic calcification in the transverse aorta. No acute fracture or lytic or blastic osseous lesions. The visualized upper abdominal bowel gas pattern is unremarkable.  IMPRESSION: No active cardiopulmonary disease.  Aortic atherosclerosis.   Electronically Signed   By: Jacqulynn Cadet M.D.   On: 11/20/2014 16:54     EKG Interpretation   Date/Time:  Monday November 20 2014 14:32:10 EDT Ventricular Rate:  70 PR Interval:  146 QRS Duration: 80 QT Interval:  354 QTC Calculation: 382 R Axis:   5 Text Interpretation:  Normal sinus rhythm Normal ECG Confirmed by  Nakyiah Kuck  MD, Ilda Laskin ([id number]) on 11/20/2014 3:40:33 PM      MDM   Final diagnoses:  Chest pain     Patient with onset of chest pain at 2:00 this afternoon described as a pressure originally 8 out of 10. Patient given aspirin here pressure sensations of very minimal currently. Associated with some  shortness of breath no nausea or vomiting.  Patient has a lot of cardiac risk factors but has no known cardiac history. Patient is already on Plavix. Patient given aspirin here additionally. Patient will require admission for rule out.  Chest x-ray without any significant findings first troponins negative basic labs without any significant abnormalities other than comprehensive metabolic panel still pending.    Fredia Sorrow, MD 11/20/14 1713  Fredia Sorrow, MD 11/30/14 [phone]

## 2014-11-20 NOTE — ED Notes (Signed)
Pt states that he was sitting in chair when he started to have central chest pain - with sob, some nausea and mild dizziness when standing

## 2014-11-21 DIAGNOSIS — R0789 Other chest pain: Secondary | ICD-10-CM | POA: Diagnosis not present

## 2014-11-21 DIAGNOSIS — R0602 Shortness of breath: Secondary | ICD-10-CM

## 2014-11-21 DIAGNOSIS — R072 Precordial pain: Secondary | ICD-10-CM

## 2014-11-21 DIAGNOSIS — I10 Essential (primary) hypertension: Secondary | ICD-10-CM | POA: Diagnosis not present

## 2014-11-21 DIAGNOSIS — I679 Cerebrovascular disease, unspecified: Secondary | ICD-10-CM | POA: Diagnosis not present

## 2014-11-21 DIAGNOSIS — R7989 Other specified abnormal findings of blood chemistry: Secondary | ICD-10-CM

## 2014-11-21 DIAGNOSIS — N184 Chronic kidney disease, stage 4 (severe): Secondary | ICD-10-CM

## 2014-11-21 LAB — BASIC METABOLIC PANEL
Anion gap: 7 (ref 5–15)
BUN: 20 mg/dL (ref 6–20)
CHLORIDE: 107 mmol/L (ref 101–111)
CO2: 25 mmol/L (ref 22–32)
CREATININE: 2.58 mg/dL — AB (ref 0.61–1.24)
Calcium: 9.1 mg/dL (ref 8.9–10.3)
GFR calc Af Amer: 27 mL/min — ABNORMAL LOW (ref >60)
GFR calc non Af Amer: 23 mL/min — ABNORMAL LOW (ref >60)
Glucose, Bld: 136 mg/dL — ABNORMAL HIGH (ref 65–99)
Potassium: 3.7 mmol/L (ref 3.5–5.1)
Sodium: 139 mmol/L (ref 135–145)

## 2014-11-21 LAB — TROPONIN I: TROPONIN I: 0.05 ng/mL — AB (ref <0.031)

## 2014-11-21 NOTE — Progress Notes (Signed)
Pt discharged home today per Dr. Sarajane Jews.  Pt's IV site D/C'd and WDL.  Pt's VSS.  Pt provided with home medication list and discharge instructions.  Verbalized understanding.  Pt left floor via WC in stable condition accompanied by NT.

## 2014-11-21 NOTE — Discharge Summary (Addendum)
Physician Discharge Summary  Ryan Rollins EZM:[id number] DOB: [birth year] DOA: 11/20/2014  PCP: Ryan Rollins., MD  Admit date: 11/20/2014 Discharge date: 11/21/2014  Recommendations for Outpatient Follow-up:  1. Follow up as outpatient with cardiology for stress test  2. Follow up as outpatient for CKD 3. Consider ASA   Follow-up Information    Follow up with Ryan Sims, NP On 11/30/2014.   Specialties:  Nurse Practitioner, Radiology, Cardiology   Why:  2:10   Contact information:   [address] [place] [address] [phone]       Follow up with Ryan Rollins On 11/27/2014.   Specialty:  Radiology   Why:  9:30. Nothing to eat or drink after midnight before test in am.    Contact information:   [address] [id number] [place] [phone]     Discharge Diagnoses:  1. Chest pain 2. HTN 3. CKD Stage IV 4. Cerebrovascular disease, stable.  Discharge Condition: Improved Disposition: Home   Diet recommendation: Heart healthy   Filed Weights   11/20/14 1437 11/20/14 1930  Weight: 145.151 kg (320 lb) 139.6 kg (307 lb 12.2 oz)    History of present illness:  74 yom with a hx of HTN and cerebrovascular disease presented with CP was admitted for further evaluation and monitoring.  Hospital Course:  Mr Ryan Rollins presented with chest pain which improved with aspirin. He ruled out for ACS. He was evaluated by cardiology with recommendations for outpatient stress testing. Hospitalization was uncomplicated.    1. CP, resolved. Troponin negative, ACS ruled out, EKG non acute. 2. HTN. stable 3. Chronic kidney disease Stage IV, stable.  4. Cerebrovascular disease, stable.  Consultants:  Cardiology  No procedures  Discharge Instructions  Discharge Instructions    Activity as tolerated - No restrictions    Complete by:  As directed      Diet - low sodium heart healthy    Complete by:  As directed      Discharge  instructions    Complete by:  As directed   Call your physician or seek immediate medical attention for chest pain, shortness of breath, heavy sweating, pain in arm or neck or worsening of condition.            Discharge Medication List as of 11/21/2014 12:58 PM    CONTINUE these medications which have NOT CHANGED   Details  albuterol (PROVENTIL HFA;VENTOLIN HFA) 108 (90 BASE) MCG/ACT inhaler Inhale 2 puffs into the lungs every 6 (six) hours as needed for wheezing or shortness of breath., Until Discontinued, Historical Med    bisacodyl (LAXATIVE) 5 MG EC tablet Take 5 mg by mouth every other day., Until Discontinued, Historical Med    clopidogrel (PLAVIX) 75 MG tablet Take 75 mg by mouth daily., Until Discontinued, Historical Med    enalapril (VASOTEC) 20 MG tablet Take 20 mg by mouth 2 (two) times daily. , Until Discontinued, Historical Med    furosemide (LASIX) 40 MG tablet Take 20-40 mg by mouth daily as needed for fluid (take 1/2-1 tablet daily as needed.). , Until Discontinued, Historical Med    guaiFENesin (MUCINEX) 600 MG 12 hr tablet Take 2 tablets (1,200 mg total) by mouth 2 (two) times daily., Starting 10/25/2014, Until Discontinued, Normal    HYDROcodone-acetaminophen (NORCO) 10-325 MG per tablet Take 1 tablet by mouth 3 (three) times daily as needed. pain, Until Discontinued, Historical Med    levocetirizine (XYZAL) 5 MG tablet Take 5 mg by  mouth every evening., Until Discontinued, Historical Med    NIFEdipine (PROCARDIA-XL/ADALAT CC) 60 MG 24 hr tablet Take 60 mg by mouth 2 (two) times daily., Until Discontinued, Historical Med    pantoprazole (PROTONIX) 40 MG tablet Take 40 mg by mouth 2 (two) times daily., Until Discontinued, Historical Med    Tamsulosin HCl (FLOMAX) 0.4 MG CAPS Take 0.4 mg by mouth 2 (two) times daily. , Until Discontinued, Historical Med       Allergies  Allergen Reactions  . Morphine And Related Hives  . Penicillins Hives    The results of  significant diagnostics from this hospitalization (including imaging, microbiology, ancillary and laboratory) are listed below for reference.    Significant Diagnostic Studies: Dg Chest 2 View  11/20/2014   CLINICAL DATA:  74 year old male with chest pain (central chest heaviness accompanied by shortness of breath, dizziness and nausea)  EXAM: CHEST  2 VIEW  COMPARISON:  Prior chest x-ray 10/23/2014  FINDINGS: The lungs are clear and negative for focal airspace consolidation, pulmonary edema or suspicious pulmonary nodule. No pleural effusion or pneumothorax. Cardiac and mediastinal contours are within normal limits. Atherosclerotic calcification in the transverse aorta. No acute fracture or lytic or blastic osseous lesions. The visualized upper abdominal bowel gas pattern is unremarkable.  IMPRESSION: No active cardiopulmonary disease.  Aortic atherosclerosis.   Electronically Signed   By: Ryan Rollins M.D.   On: 11/20/2014 16:54   Labs:  Basic Metabolic Panel:  Recent Labs Lab 11/20/14 1633 11/21/14 0901  NA 138 139  K 3.8 3.7  CL 105 107  CO2 23 25  GLUCOSE 86 136*  BUN 20 20  CREATININE 2.84* 2.58*  CALCIUM 9.3 9.1   Liver Function Tests:  Recent Labs Lab 11/20/14 1633  AST 21  ALT 19  ALKPHOS 103  BILITOT 0.5  PROT 7.1  ALBUMIN 4.2   CBC:  Recent Labs Lab 11/20/14 1633  WBC 6.4  NEUTROABS 4.2  HGB 13.8  HCT 41.5  MCV 88.5  PLT 229   Cardiac Enzymes:  Recent Labs Lab 11/20/14 1633 11/20/14 2053 11/20/14 2356  TROPONINI 0.05* 0.05* 0.05*     Principal Problem:   Chest pain Active Problems:   Chronic kidney disease (CKD), stage IV (severe)   Hypertension   Cerebrovascular disease   Time coordinating discharge: 35 minutes   Signed:  Murray Hodgkins, MD [facility] 11/21/2014, 12:11 PM   I, Ryan Rollins, acting a scribe, recorded this note contemporaneously in the presence of Dr. Melene Rollins. Ryan Rollins, M.D. on 11/21/2014  I have  reviewed the above documentation for accuracy and completeness, and I agree with the above. Ryan Hodgkins, MD

## 2014-11-21 NOTE — Progress Notes (Signed)
  PROGRESS NOTE  Ryan Rollins XKG:[id number] DOB: 11-Sep-1940 DOA: 11/20/2014 PCP: Glenda Chroman., MD  Summary: 43 yom with a hx of HTN and cerebrovascular disease presented with CP was admitted for further evaluation and monitoring.  Assessment/Plan: 1. CP, resolved. Some typical and atypical features.  Troponin negative, ACS ruled out, EKG non acute. 2. HTN. stable 3. Chronic kidney disease Stage IV, stable.  4. Cerebrovascular disease, stable.    Home today  Evaluated by cardio, recommended outpatient stress test which will be arranged  Suggest outpatient for follow up for CKD, defer to PCP  Consider ASA, defer to PCP  Code Status: FULL DVT prophylaxis: Lovenox Family Communication: pt is alone. Care plan was discussed and no questions  Disposition Plan: Home today  Murray Hodgkins, MD  [facility]  Pager [phone] If 7PM-7AM, please contact night-coverage at [email or web address], password [email or web address] 11/21/2014, 6:30 AM  LOS: 1 day   Consultants:  Cardiology  Procedures:    Antibiotics:    HPI/Subjective: Feels pretty good, breathing is better. Eating okay, no n/v.  Heaviness in chest is gone. No recent travel and no history of blood clots. 4 prior strokes.  Objective: Filed Vitals:   11/20/14 1930 11/20/14 2143 11/21/14 0211 11/21/14 0550  BP: 155/122 138/78 134/74 125/76  Pulse: 75 66 59 63  Temp: 97.6 F (36.4 C) 97.6 F (36.4 C) 97.6 F (36.4 C) 97.7 F (36.5 C)  TempSrc: Oral Oral Oral Oral  Resp: 20 20 20 20   Height:      Weight: 139.6 kg (307 lb 12.2 oz)     SpO2: 100% 100% 98% 100%   No intake or output data in the 24 hours ending 11/21/14 0630   Filed Weights   11/20/14 1437 11/20/14 1930  Weight: 145.151 kg (320 lb) 139.6 kg (307 lb 12.2 oz)    Exam:     Afebrile, not hypoxic General:  Appears calm and comfortable, sitting in chair Cardiovascular: RRR, no m/r/g. No LE edema.  Respiratory: CTA bilaterally, no w/r/r. Normal respiratory  effort. Telemetry: SR, no arrhythmias  Psychiatric: grossly normal mood and affect, speech fluent and appropriate   New data reviewed:  BMP unremarkable, creatinine 2.58, baseline  Trop flat, .05  Pertinent data since admission:  CXR independently reviewed, no acute disease  EKG indpendently reviewed, SR. No acute changes   Pending data:    Scheduled Meds: . bisacodyl  5 mg Oral QODAY  . clopidogrel  75 mg Oral Daily  . enalapril  20 mg Oral BID  . enoxaparin (LOVENOX) injection  30 mg Subcutaneous Q24H  . NIFEdipine  60 mg Oral BID  . pantoprazole  40 mg Oral BID  . tamsulosin  0.4 mg Oral BID   Continuous Infusions: . sodium chloride 100 mL/hr at 11/20/14 1600    Principal Problem:   Chest pain Active Problems:   CKD (chronic kidney disease)   Hypertension   Cerebrovascular disease     I, Arielle Khosrowpour, acting a scribe, recorded this note contemporaneously in the presence of Dr. Melene Plan. Sarajane Jews, M.D. on 11/21/2014    I have reviewed the above documentation for accuracy and completeness, and I agree with the above. Murray Hodgkins, MD

## 2014-11-21 NOTE — Consult Note (Signed)
CARDIOLOGY CONSULT NOTE   Patient ID: Ryan Rollins MRN: [id number] DOB/AGE: Nov 04, 1940 74 y.o.  Admit Date: 11/20/2014 Referring Physician: PTH-Gosarani  Primary Physician: Glenda Chroman., MD Consulting Cardiologist: Bronson Ing. Jamesetta So MD Primary Cardiologist: Carlyle Dolly MD [facility] [place]) Reason for Consultation: Chest Pain   Clinical Summary Mr. Tones is a 74 y.o.male with HTN, HLD, CKD stage 3, CVA x 4, ongoing tobacco abuse, with chronic chest tightness and pressure. He was recently admitted in July of 2016 for similar complaints with elevated troponin related to CKD. He was not found to have a cardiac etiology for chest discomfort and was recommended for medical therapy with no ischemic testing planned.   He presented to the ER after experiencing sudden onset of chest pain. He was at rest, sitting watching TV, when he felt a "heavy ball" in the middle of his chest. It did not radiate, cause dizziness, or diaphoresis. He had some trouble breathing due to the pressure. Last for a couple of hours. Came to ER when pain did not subside. Pain continued until late last evening after receiving IV fluids. Pain is completely eliminated. Denies dysphagia.   BP 134/74,  HR 71, O2 sat 99%. Troponin 0.05, 0.05, and 0.05 respectively which are actually improved from recent admission (0.07-0.06-.0.05); EKG NSR without acute ST-T wave abnormalities. Creatinine elevated at 2.84, Potassium 3.8, he was not found to be anemic. CXR did not reveal CHF or pneumonia. He was treated with ASA 324 mg and IV fluids. We are asked for cardiac recommendations concerning treatment for chest pain. Last stress test was in 1987 after aneursymal CVA. He denies medical non-compliance.   Allergies  Allergen Reactions  . Morphine And Related Hives  . Penicillins Hives    Medications Scheduled Medications: . bisacodyl  5 mg Oral QODAY  . clopidogrel  75 mg Oral Daily  . enalapril  20 mg Oral BID  . enoxaparin  (LOVENOX) injection  30 mg Subcutaneous Q24H  . NIFEdipine  60 mg Oral BID  . pantoprazole  40 mg Oral BID  . tamsulosin  0.4 mg Oral BID    Infusions: . sodium chloride 100 mL/hr at 11/20/14 1600    PRN Medications: acetaminophen, albuterol, guaiFENesin, HYDROcodone-acetaminophen, loratadine, ondansetron (ZOFRAN) IV   Past Medical History  Diagnosis Date  . Cerebral aneurysm   . Stroke   . Hypertension   . Hypercholesterolemia   . Prostate cancer   . CKD (chronic kidney disease) stage 3, GFR 30-59 ml/min   . Cerebrovascular disease 11/20/2014    Past Surgical History  Procedure Laterality Date  . Cholecystectomy    . Ankle surgery    . Prostate surgery  biopsy      Family History  Problem Relation Age of Onset  . Hypertension Mother   . Diabetes Mother   . Hypertension Father   . Congestive Heart Failure Mother     Social History Mr. Mcgue reports that he has been smoking Cigarettes.  He has a 15 pack-year smoking history. He does not have any smokeless tobacco history on file. Mr. Bayus reports that he drinks alcohol.  Review of Systems Complete review of systems are found to be negative unless outlined in H&P above.  Physical Examination Blood pressure 125/76, pulse 63, temperature 97.7 F (36.5 C), temperature source Oral, resp. rate 20, height 6' (1.829 m), weight 307 lb 12.2 oz (139.6 kg), SpO2 100 %. No intake or output data in the 24 hours ending 11/21/14 0755  GEN: No acute  distress.  HEENT: Conjunctiva and lids normal, oropharynx clear with moist mucosa. Neck: Supple, no elevated JVP or carotid bruits, no thyromegaly. Lungs: Clear to auscultation, nonlabored breathing at rest. Cardiac: Regular rate and rhythm, no S3 or significant systolic murmur, no pericardial rub. Abdomen: Soft, nontender, no hepatomegaly, bowel sounds present, no guarding or rebound. Obese. Extremities: No pitting edema, distal pulses 2+. Skin: Warm and  dry. Musculoskeletal: No kyphosis. Neuropsychiatric: Alert and oriented x3, affect grossly appropriate.  Prior Cardiac Testing/Procedures 1. Echocardiogram 10/24/2014 Left ventricle: The cavity size was normal. Wall thickness was increased in a pattern of moderate LVH. Systolic function was normal. The estimated ejection fraction was in the range of 55% to 60%. Wall motion was normal; there were no regional wall motion abnormalities. Left ventricular diastolic function parameters were normal. - Aortic valve: Valve area (VTI): 4.11 cm^2. Valve area (Vmax): 3.78 cm^2. - Mitral valve: There was mild regurgitation. - Technically adequate study.  Lab Results  Basic Metabolic Panel:  Recent Labs Lab 11/20/14 1633  NA 138  K 3.8  CL 105  CO2 23  GLUCOSE 86  BUN 20  CREATININE 2.84*  CALCIUM 9.3    Liver Function Tests:  Recent Labs Lab 11/20/14 1633  AST 21  ALT 19  ALKPHOS 103  BILITOT 0.5  PROT 7.1  ALBUMIN 4.2    CBC:  Recent Labs Lab 11/20/14 1633  WBC 6.4  NEUTROABS 4.2  HGB 13.8  HCT 41.5  MCV 88.5  PLT 229    Cardiac Enzymes:  Recent Labs Lab 11/20/14 1633 11/20/14 2053 11/20/14 2356  TROPONINI 0.05* 0.05* 0.05*    BNP: Invalid input(s): POCBNP   Radiology: Dg Chest 2 View  11/20/2014   CLINICAL DATA:  74 year old male with chest pain (central chest heaviness accompanied by shortness of breath, dizziness and nausea)  EXAM: CHEST  2 VIEW  COMPARISON:  Prior chest x-ray 10/23/2014  FINDINGS: The lungs are clear and negative for focal airspace consolidation, pulmonary edema or suspicious pulmonary nodule. No pleural effusion or pneumothorax. Cardiac and mediastinal contours are within normal limits. Atherosclerotic calcification in the transverse aorta. No acute fracture or lytic or blastic osseous lesions. The visualized upper abdominal bowel gas pattern is unremarkable.  IMPRESSION: No active cardiopulmonary disease.  Aortic  atherosclerosis.   Electronically Signed   By: Jacqulynn Cadet M.D.   On: 11/20/2014 16:54     ECG: NSR no acute ST T wave changes.    Impression and Recommendations  1. Chest Pain; Typical and atypical presentation. Centralized, sternal heaviness, occuring at rest, without associated symptoms or radiation. Lasting hours with relief after admission  with IV fluids and ASA. States he had a tingling feeling as the pressure released. Denies dysphagia, or burping. Troponin is actually better than last admission. No acute ST-T wave changes. Last stress test was in 1987 in the setting of aneurysmal CVA and was negative per patient. Completed at [place] did not provide any information.   Can consider OP lexiscan MPI as he is now pain free and troponin is stable.   2. Hx of CVA X 4: Pt states first was in 1987 which was aneurysmal, with subsequent CVAs related to hypertension in the setting of medical non-compliance. He has no residual weakness or paraesthesias. . Continues on clopidogrel as OP.   3. Hypertension: BP was normal on admission to ER. Has been stable during admission. Continue ACE, procardia, .   4. AKI: Is on lasix at home.  Creatinine was elevated at 2.84 on admission. Has been given IV fluids. Would hold lasix for now. Repeat BMET.     Signed: Phill Myron. Lawrence NP AACC  11/21/2014, 7:55 AM Co-Sign MD  The patient was seen and examined, and I agree with the assessment and plan as documented above, with modifications as noted below. Patient with aforementioned history admitted with chest pressure and shortness of breath who has since ruled out for an acute coronary syndrome with minimally elevated flat troponins, consistent with trend from prior admission in July. ECG showed normal sinus rhythm with PAC. Chest xray normal. Prior echocardiogram results noted above (normal LV function and normal wall motion). Currently denies cardiac and pulmonary  symptoms.  We will arrange for outpatient Lexiscan Cardiolite stress test and follow up afterwards with Dr. Harl Bowie.

## 2014-11-21 NOTE — Care Management Note (Signed)
Case Management Note  Patient Details  Name: Ryan Rollins MRN: [id number] Date of Birth: 1940-09-20  Expected Discharge Date:                  Expected Discharge Plan:  Home/Self Care  In-House Referral:  NA  Discharge planning Services  CM Consult  Post Acute Care Choice:  NA Choice offered to:  NA  DME Arranged:    DME Agency:     HH Arranged:    [place] Agency:     Status of Service:  Completed, signed off  Medicare Important Message Given:    Date Medicare IM Given:    Medicare IM give by:    Date Additional Medicare IM Given:    Additional Medicare Important Message give by:     If discussed at [place] of Stay Meetings, dates discussed:    Additional Comments: Pt is from home and independent with ADL's. Pt plans to discharge home today with self care. No CM needs.  Sherald Barge, RN 11/21/2014, 12:49 PM

## 2014-11-22 ENCOUNTER — Encounter: Payer: Medicare Other | Admitting: Adult Health

## 2014-11-23 ENCOUNTER — Other Ambulatory Visit: Payer: Self-pay | Admitting: Adult Health

## 2014-11-23 DIAGNOSIS — R079 Chest pain, unspecified: Secondary | ICD-10-CM

## 2014-11-24 ENCOUNTER — Encounter (HOSPITAL_COMMUNITY): Payer: Medicare Other

## 2014-11-27 ENCOUNTER — Encounter (HOSPITAL_COMMUNITY): Payer: Medicare Other

## 2014-11-27 ENCOUNTER — Inpatient Hospital Stay (HOSPITAL_COMMUNITY): Admit: 2014-11-27 | Payer: Medicare Other

## 2014-11-28 ENCOUNTER — Encounter: Payer: Medicare Other | Admitting: Adult Health

## 2014-11-28 ENCOUNTER — Encounter (HOSPITAL_COMMUNITY): Payer: Medicare Other

## 2014-11-30 ENCOUNTER — Encounter: Payer: Medicare Other | Admitting: Adult Health

## 2014-12-05 ENCOUNTER — Ambulatory Visit (INDEPENDENT_AMBULATORY_CARE_PROVIDER_SITE_OTHER): Payer: Medicare Other | Admitting: Urology

## 2014-12-05 DIAGNOSIS — C61 Malignant neoplasm of prostate: Secondary | ICD-10-CM | POA: Diagnosis not present

## 2014-12-05 DIAGNOSIS — N5201 Erectile dysfunction due to arterial insufficiency: Secondary | ICD-10-CM | POA: Diagnosis not present

## 2014-12-11 ENCOUNTER — Inpatient Hospital Stay (HOSPITAL_COMMUNITY): Admission: RE | Admit: 2014-12-11 | Payer: Medicare Other | Source: Ambulatory Visit

## 2014-12-11 ENCOUNTER — Encounter (HOSPITAL_COMMUNITY): Payer: Medicare Other

## 2014-12-12 ENCOUNTER — Encounter (HOSPITAL_COMMUNITY): Payer: Medicare Other

## 2014-12-22 ENCOUNTER — Inpatient Hospital Stay (HOSPITAL_COMMUNITY): Admission: RE | Admit: 2014-12-22 | Payer: Medicare Other | Source: Ambulatory Visit

## 2014-12-25 ENCOUNTER — Encounter (HOSPITAL_COMMUNITY): Payer: Medicare Other

## 2014-12-25 ENCOUNTER — Inpatient Hospital Stay (HOSPITAL_COMMUNITY): Admission: RE | Admit: 2014-12-25 | Payer: Medicare Other | Source: Ambulatory Visit

## 2014-12-26 ENCOUNTER — Ambulatory Visit: Payer: Medicare Other | Admitting: Urology

## 2014-12-26 ENCOUNTER — Encounter (HOSPITAL_COMMUNITY): Payer: Medicare Other

## 2015-04-03 ENCOUNTER — Ambulatory Visit: Payer: Medicare Other | Admitting: Urology

## 2015-04-24 ENCOUNTER — Ambulatory Visit (INDEPENDENT_AMBULATORY_CARE_PROVIDER_SITE_OTHER): Payer: Medicare Other | Admitting: Urology

## 2015-04-24 DIAGNOSIS — Z8546 Personal history of malignant neoplasm of prostate: Secondary | ICD-10-CM

## 2015-04-24 DIAGNOSIS — N5201 Erectile dysfunction due to arterial insufficiency: Secondary | ICD-10-CM | POA: Diagnosis not present

## 2015-06-08 ENCOUNTER — Other Ambulatory Visit (HOSPITAL_COMMUNITY): Payer: Self-pay | Admitting: Nephrology

## 2015-06-08 DIAGNOSIS — N183 Chronic kidney disease, stage 3 unspecified: Secondary | ICD-10-CM

## 2015-06-08 DIAGNOSIS — I129 Hypertensive chronic kidney disease with stage 1 through stage 4 chronic kidney disease, or unspecified chronic kidney disease: Secondary | ICD-10-CM

## 2015-06-19 ENCOUNTER — Ambulatory Visit (HOSPITAL_COMMUNITY): Admission: RE | Admit: 2015-06-19 | Payer: Medicare Other | Source: Ambulatory Visit

## 2015-06-27 ENCOUNTER — Ambulatory Visit (HOSPITAL_COMMUNITY)
Admission: RE | Admit: 2015-06-27 | Discharge: 2015-06-27 | Disposition: A | Discharge status: Home / Self Care | Payer: Medicare Other | Source: Ambulatory Visit | Attending: Nephrology | Admitting: Nephrology

## 2015-06-27 DIAGNOSIS — N183 Chronic kidney disease, stage 3 unspecified: Secondary | ICD-10-CM

## 2015-06-27 DIAGNOSIS — N281 Cyst of kidney, acquired: Secondary | ICD-10-CM | POA: Insufficient documentation

## 2015-06-27 DIAGNOSIS — I129 Hypertensive chronic kidney disease with stage 1 through stage 4 chronic kidney disease, or unspecified chronic kidney disease: Secondary | ICD-10-CM | POA: Insufficient documentation

## 2015-08-21 ENCOUNTER — Ambulatory Visit (HOSPITAL_COMMUNITY): Payer: Medicare Other | Admitting: Hematology & Oncology

## 2015-08-22 ENCOUNTER — Ambulatory Visit (HOSPITAL_COMMUNITY): Payer: Medicare Other | Admitting: Hematology & Oncology

## 2015-09-03 ENCOUNTER — Ambulatory Visit (HOSPITAL_COMMUNITY): Payer: Medicare Other | Admitting: Hematology & Oncology

## 2015-09-18 ENCOUNTER — Ambulatory Visit (HOSPITAL_COMMUNITY): Payer: Medicare Other | Admitting: Hematology & Oncology

## 2015-09-18 NOTE — Progress Notes (Signed)
This encounter was created in error - please disregard.

## 2015-10-02 ENCOUNTER — Ambulatory Visit: Payer: Medicare Other | Admitting: Urology

## 2015-11-06 ENCOUNTER — Ambulatory Visit (INDEPENDENT_AMBULATORY_CARE_PROVIDER_SITE_OTHER): Payer: Medicare Other | Admitting: Urology

## 2015-11-06 DIAGNOSIS — C61 Malignant neoplasm of prostate: Secondary | ICD-10-CM

## 2016-03-07 ENCOUNTER — Emergency Department (HOSPITAL_COMMUNITY)
Admission: EM | Admit: 2016-03-07 | Discharge: 2016-03-07 | Disposition: A | Discharge status: Home / Self Care | Payer: Medicare Other | Attending: Emergency Medicine | Admitting: Emergency Medicine

## 2016-03-07 ENCOUNTER — Encounter (HOSPITAL_COMMUNITY): Payer: Self-pay | Admitting: Emergency Medicine

## 2016-03-07 ENCOUNTER — Emergency Department (HOSPITAL_COMMUNITY): Payer: Medicare Other

## 2016-03-07 DIAGNOSIS — Z8546 Personal history of malignant neoplasm of prostate: Secondary | ICD-10-CM | POA: Insufficient documentation

## 2016-03-07 DIAGNOSIS — R09A2 Foreign body sensation, throat: Secondary | ICD-10-CM

## 2016-03-07 DIAGNOSIS — N184 Chronic kidney disease, stage 4 (severe): Secondary | ICD-10-CM | POA: Diagnosis not present

## 2016-03-07 DIAGNOSIS — R0989 Other specified symptoms and signs involving the circulatory and respiratory systems: Secondary | ICD-10-CM | POA: Diagnosis present

## 2016-03-07 DIAGNOSIS — Z79899 Other long term (current) drug therapy: Secondary | ICD-10-CM | POA: Insufficient documentation

## 2016-03-07 DIAGNOSIS — I129 Hypertensive chronic kidney disease with stage 1 through stage 4 chronic kidney disease, or unspecified chronic kidney disease: Secondary | ICD-10-CM | POA: Insufficient documentation

## 2016-03-07 DIAGNOSIS — F17210 Nicotine dependence, cigarettes, uncomplicated: Secondary | ICD-10-CM | POA: Diagnosis not present

## 2016-03-07 MED ORDER — GI COCKTAIL ~~LOC~~
30.0000 mL | Freq: Once | ORAL | Status: AC
  Administered 2016-03-07: 30 mL via ORAL
  Filled 2016-03-07: qty 30

## 2016-03-07 NOTE — ED Triage Notes (Signed)
Pt reports was eating fish x3 days ago and reports " I think part of it broke off and is stuck in my throat". Pt reports throat soreness and neck soreness. Airway patent.

## 2016-03-07 NOTE — ED Notes (Signed)
Patient transported to X-ray 

## 2016-03-07 NOTE — ED Notes (Signed)
Returned from xray

## 2016-03-07 NOTE — ED Provider Notes (Signed)
[place] DEPT Provider Note   CSN: [id number] Arrival date & time: 03/07/16  1021     History   Chief Complaint Chief Complaint  Patient presents with  . Foreign Body    HPI Ryan Rollins is a 75 y.o. male.  HPI Patient says he was eating fish 3 days ago when he had felt he had a bone stuck in his throat. He then ate some bread which improved the form body sensation but has had persistent pain to the right anterior neck worsened with swallowing. No vomiting. Tolerating secretions. No difficulty breathing. No fever or chills. Past Medical History:  Diagnosis Date  . Cerebral aneurysm   . Cerebrovascular disease 11/20/2014  . CKD (chronic kidney disease) stage 3, GFR 30-59 ml/min   . Hypercholesterolemia   . Hypertension   . Prostate cancer ([place])   . Stroke [facility])     Patient Active Problem List   Diagnosis Date Noted  . Chest pain 11/20/2014  . Hypertension 11/20/2014  . Cerebrovascular disease 11/20/2014  . Cough 10/24/2014  . Hyperglycemia 10/24/2014  . Hypokalemia 10/24/2014  . Chronic kidney disease (CKD), stage IV (severe) ([place])   . Elevated troponin 10/23/2014    Past Surgical History:  Procedure Laterality Date  . ANKLE SURGERY    . CHOLECYSTECTOMY    . prostate surgery  biopsy         Home Medications    Prior to Admission medications   Medication Sig Start Date End Date Taking? Authorizing Provider  albuterol (PROVENTIL HFA;VENTOLIN HFA) 108 (90 BASE) MCG/ACT inhaler Inhale 2 puffs into the lungs every 6 (six) hours as needed for wheezing or shortness of breath.   Yes Historical Provider, MD  bisacodyl (LAXATIVE) 5 MG EC tablet Take 5 mg by mouth every other day.   Yes Historical Provider, MD  clopidogrel (PLAVIX) 75 MG tablet Take 75 mg by mouth daily.   Yes Historical Provider, MD  enalapril (VASOTEC) 20 MG tablet Take 20 mg by mouth 2 (two) times daily.    Yes Historical Provider, MD  furosemide (LASIX) 40 MG tablet Take 20-40 mg by mouth  daily as needed for fluid (take 1/2-1 tablet daily as needed.).    Yes Historical Provider, MD  HYDROcodone-acetaminophen (NORCO) 10-325 MG per tablet Take 1 tablet by mouth 2 (two) times daily. pain   Yes Historical Provider, MD  levocetirizine (XYZAL) 5 MG tablet Take 5 mg by mouth every evening.   Yes Historical Provider, MD  NIFEdipine (PROCARDIA-XL/ADALAT CC) 60 MG 24 hr tablet Take 60 mg by mouth 2 (two) times daily.   Yes Historical Provider, MD  pantoprazole (PROTONIX) 40 MG tablet Take 40 mg by mouth 2 (two) times daily.   Yes Historical Provider, MD  Tamsulosin HCl (FLOMAX) 0.4 MG CAPS Take 0.4 mg by mouth 2 (two) times daily.    Yes Historical Provider, MD    Family History Family History  Problem Relation Age of Onset  . Hypertension Mother   . Diabetes Mother   . Congestive Heart Failure Mother   . Hypertension Father     Social History Social History  Substance Use Topics  . Smoking status: Current Every Day Smoker    Packs/day: 0.25    Years: 60.00    Types: Cigarettes  . Smokeless tobacco: Never Used  . Alcohol use 0.0 oz/week     Comment: occ     Allergies   Morphine and related and Penicillins   Review of Systems Review  of Systems  Constitutional: Negative for chills and fever.  HENT: Positive for sore throat. Negative for trouble swallowing and voice change.   Gastrointestinal: Negative for abdominal pain, nausea and vomiting.  All other systems reviewed and are negative.    Physical Exam Updated Vital Signs BP 118/77 (BP Location: Right Arm)   Pulse 60   Temp 97.5 F (36.4 C) (Oral)   Resp 20   Ht 6\' 2"  (1.88 m)   Wt 298 lb (135.2 kg)   SpO2 99%   BMI 38.26 kg/m   Physical Exam  Constitutional: He is oriented to person, place, and time. He appears well-developed and well-nourished. No distress.  HENT:  Head: Normocephalic and atraumatic.  Mouth/Throat: Oropharynx is clear and moist.  Oropharynx is clear.  Eyes: EOM are normal. Pupils  are equal, round, and reactive to light.  Neck: Normal range of motion. Neck supple.  Cardiovascular: Normal rate.   Pulmonary/Chest: Effort normal and breath sounds normal. No stridor.  Abdominal: Soft. Bowel sounds are normal. There is no tenderness. There is no rebound and no guarding.  Musculoskeletal: Normal range of motion. He exhibits no edema or tenderness.  Lymphadenopathy:    He has no cervical adenopathy.  Neurological: He is alert and oriented to person, place, and time.  Skin: Skin is warm and dry. No rash noted. No erythema.  Psychiatric: He has a normal mood and affect. His behavior is normal.  Nursing note and vitals reviewed.    ED Treatments / Results  Labs (all labs ordered are listed, but only abnormal results are displayed) Labs Reviewed - No data to display  EKG  EKG Interpretation None       Radiology Dg Neck Soft Tissue  Result Date: 03/07/2016 CLINICAL DATA:  Possible fish bone stuck in patient's throat. EXAM: NECK SOFT TISSUES - 1+ VIEW COMPARISON:  None. FINDINGS: No visualized radiopaque foreign body. There is no evidence of retropharyngeal soft tissue swelling or epiglottic enlargement. The cervical airway is unremarkable. 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 IMPRESSION: Negative. Electronically Signed   By: Aletta Edouard M.D.   On: 03/07/2016 13:29    Procedures Procedures (including critical care time)  Medications Ordered in ED Medications  gi cocktail (Maalox,Lidocaine,Donnatal) (30 mLs Oral Given 03/07/16 1330)     Initial Impression / Assessment and Plan / ED Course  I have reviewed the triage vital signs and the nursing notes.  Pertinent labs & imaging results that were available during my care of the patient were reviewed by me and considered in my medical decision making (see chart for details).  Clinical Course     No foreign body and x-ray. Patient  given GI cocktail with improvement of symptoms. Advised to call ENT on Monday if sensation is still present. Many upper endoscopy to rule out foreign body. He understands the need to return immediately for any difficulty swallowing, difficulty breathing, persistent vomiting, fever or any concerns.  Final Clinical Impressions(s) / ED Diagnoses   Final diagnoses:  Foreign body sensation in throat    New Prescriptions New Prescriptions   No medications on file     Julianne Rice, MD 03/07/16 [phone]

## 2016-03-11 ENCOUNTER — Other Ambulatory Visit: Payer: Self-pay | Admitting: Urology

## 2016-03-11 ENCOUNTER — Ambulatory Visit (INDEPENDENT_AMBULATORY_CARE_PROVIDER_SITE_OTHER): Payer: Medicare Other | Admitting: Urology

## 2016-03-11 DIAGNOSIS — C61 Malignant neoplasm of prostate: Secondary | ICD-10-CM

## 2016-03-11 DIAGNOSIS — Z8546 Personal history of malignant neoplasm of prostate: Secondary | ICD-10-CM

## 2016-03-11 DIAGNOSIS — R9721 Rising PSA following treatment for malignant neoplasm of prostate: Secondary | ICD-10-CM | POA: Diagnosis not present

## 2016-03-17 ENCOUNTER — Encounter (HOSPITAL_COMMUNITY): Payer: Self-pay | Admitting: Hematology & Oncology

## 2016-03-17 ENCOUNTER — Encounter (HOSPITAL_COMMUNITY): Payer: Medicare Other

## 2016-03-17 ENCOUNTER — Encounter (HOSPITAL_COMMUNITY): Payer: Medicare Other | Attending: Hematology & Oncology | Admitting: Hematology & Oncology

## 2016-03-17 VITALS — BP 98/78 | HR 84 | Resp 18 | Ht 75.0 in | Wt 290.0 lb

## 2016-03-17 DIAGNOSIS — D472 Monoclonal gammopathy: Secondary | ICD-10-CM

## 2016-03-17 DIAGNOSIS — N184 Chronic kidney disease, stage 4 (severe): Secondary | ICD-10-CM

## 2016-03-17 DIAGNOSIS — Z8546 Personal history of malignant neoplasm of prostate: Secondary | ICD-10-CM

## 2016-03-17 DIAGNOSIS — D631 Anemia in chronic kidney disease: Secondary | ICD-10-CM | POA: Diagnosis not present

## 2016-03-17 DIAGNOSIS — C61 Malignant neoplasm of prostate: Secondary | ICD-10-CM

## 2016-03-17 LAB — CBC WITH DIFFERENTIAL/PLATELET
BASOS ABS: 0 10*3/uL (ref 0.0–0.1)
BASOS PCT: 0 %
EOS PCT: 2 %
Eosinophils Absolute: 0.1 10*3/uL (ref 0.0–0.7)
HCT: 40.8 % (ref 39.0–52.0)
Hemoglobin: 13.6 g/dL (ref 13.0–17.0)
Lymphocytes Relative: 32 %
Lymphs Abs: 2.1 10*3/uL (ref 0.7–4.0)
MCH: 30.2 pg (ref 26.0–34.0)
MCHC: 33.3 g/dL (ref 30.0–36.0)
MCV: 90.5 fL (ref 78.0–100.0)
MONO ABS: 0.4 10*3/uL (ref 0.1–1.0)
Monocytes Relative: 7 %
Neutro Abs: 3.9 10*3/uL (ref 1.7–7.7)
Neutrophils Relative %: 59 %
Platelets: 236 10*3/uL (ref 150–400)
RBC: 4.51 MIL/uL (ref 4.22–5.81)
RDW: 15.2 % (ref 11.5–15.5)
WBC: 6.6 10*3/uL (ref 4.0–10.5)

## 2016-03-17 LAB — COMPREHENSIVE METABOLIC PANEL
ALBUMIN: 4.1 g/dL (ref 3.5–5.0)
ALT: 21 U/L (ref 17–63)
ANION GAP: 7 (ref 5–15)
AST: 22 U/L (ref 15–41)
Alkaline Phosphatase: 98 U/L (ref 38–126)
BILIRUBIN TOTAL: 0.4 mg/dL (ref 0.3–1.2)
BUN: 25 mg/dL — ABNORMAL HIGH (ref 6–20)
CHLORIDE: 108 mmol/L (ref 101–111)
CO2: 24 mmol/L (ref 22–32)
Calcium: 9.5 mg/dL (ref 8.9–10.3)
Creatinine, Ser: 2.69 mg/dL — ABNORMAL HIGH (ref 0.61–1.24)
GFR calc Af Amer: 26 mL/min — ABNORMAL LOW (ref >60)
GFR, EST NON AFRICAN AMERICAN: 22 mL/min — AB (ref >60)
Glucose, Bld: 98 mg/dL (ref 65–99)
POTASSIUM: 3.6 mmol/L (ref 3.5–5.1)
Sodium: 139 mmol/L (ref 135–145)
TOTAL PROTEIN: 7.1 g/dL (ref 6.5–8.1)

## 2016-03-17 LAB — SEDIMENTATION RATE: SED RATE: 7 mm/h (ref 0–16)

## 2016-03-17 NOTE — Progress Notes (Signed)
[facility] NOTE  Patient Care Team: Glenda Chroman, MD as PCP - General (Internal Medicine)  CHIEF COMPLAINTS/PURPOSE OF CONSULTATION:  M-spike CKD stage 3B  CKD bone mineral disease Hypertension Anemia Proteinuria Prostate cancer, s/p IMRT 10/24/2012  2 years ADT  HISTORY OF PRESENTING ILLNESS:  Ryan Rollins 75 y.o. male is here as a referral from Dr. Lowanda Foster for an M spike.   Ryan Rollins is a pleasant 75 year-old gentleman with a history of CKD stage 3B-4. Patient's CKD stage 3B is most likely secondary to hypertension. He follows with nephrology every 3 months. The patient is also seen by urology which is following his history of prostate cancer.   He was originally advised to see a hematologist at his nephrology visit on 07/04/2015 when he was found to have a positive M spike at 0.5 mg in the electrophoresis and slightly elevated calcium at 10.3. At his next nephrology visit 10/03/2015 the patient had not seen a hematologist and was again advised to see a hematologist for M spike of 0.4 mg shown on SPEP. The patient's M spike in the 24 hour urine was negative. At his nephrology visit on 01/09/2016 the patient had still not been seen by a hematologist and again was advised to do so.   Ryan Rollins presents to the [facility] unaccompanied.  He reports problems with his knees and a bad back. He denies any problems urinating. He sees Dr. Diona Fanti in urology. He has a good appetite. He denies chest pain. He was told he had an irregular heart beat but has not had any problems with his heart lately.   He had an aneurysm and a stroke at the same time the first time. After which his right foot felt asleep. After his second stroke, his left side became weak. After the third stroke, he noticed speech change. The left sided weakness and speech change are most prominent when he is sleepy.  He has never had a flu shot in his life. His PCP is Dr. Woody Seller.   The  patient is here for further evaluation, discussion, and investigation of M spike.    MEDICAL HISTORY:  Past Medical History:  Diagnosis Date  . Cerebral aneurysm   . Cerebrovascular disease 11/20/2014  . CKD (chronic kidney disease) stage 3, GFR 30-59 ml/min   . Hypercholesterolemia   . Hypertension   . Prostate cancer ([place])   . Stroke [facility])     SURGICAL HISTORY: Past Surgical History:  Procedure Laterality Date  . ANKLE SURGERY    . CHOLECYSTECTOMY    . prostate surgery  biopsy      SOCIAL HISTORY: Social History   Social History  . Marital status: Married    Spouse name: N/A  . Number of children: N/A  . Years of education: N/A   Occupational History  . Not on file.   Social History Main Topics  . Smoking status: Current Every Day Smoker    Packs/day: 0.25    Years: 60.00    Types: Cigarettes  . Smokeless tobacco: Never Used     Comment: working on [employer]  . Alcohol use 0.0 oz/week     Comment: occ  . Drug use: No  . Sexual activity: No   Other Topics Concern  . Not on file   Social History Narrative  . No narrative on file   Divorced 2 children, daughter and son 2 grandchildren 5 great grandchildren He worked as a  painter for 42 years Smoker, smokes about 1 pack every 3 days ETOH, hasn't drank since his first stroke in 1987 Substance abuse: Previous Cocaine use Born in [place]: Family History  Problem Relation Age of Onset  . Hypertension Mother   . Diabetes Mother   . Congestive Heart Failure Mother   . Hypertension Father    Mother deceased from congestive heart failure Father deceased at [place] 2 brothers, healthy.  ALLERGIES:  is allergic to morphine and related and penicillins.  MEDICATIONS:  Current Outpatient Prescriptions  Medication Sig Dispense Refill  . albuterol (PROVENTIL HFA;VENTOLIN HFA) 108 (90 BASE) MCG/ACT inhaler Inhale 2 puffs into the lungs every 6 (six) hours as needed for  wheezing or shortness of breath.    . bisacodyl (LAXATIVE) 5 MG EC tablet Take 5 mg by mouth every other day.    . clopidogrel (PLAVIX) 75 MG tablet Take 75 mg by mouth daily.    . DULoxetine (CYMBALTA) 60 MG capsule     . enalapril (VASOTEC) 20 MG tablet Take 20 mg by mouth 2 (two) times daily.     . furosemide (LASIX) 40 MG tablet Take 20-40 mg by mouth daily as needed for fluid (take 1/2-1 tablet daily as needed.).     Marland Kitchen HYDROcodone-acetaminophen (NORCO) 10-325 MG per tablet Take 1 tablet by mouth 2 (two) times daily. pain    . levocetirizine (XYZAL) 5 MG tablet Take 5 mg by mouth every evening.    Marland Kitchen NIFEdipine (PROCARDIA-XL/ADALAT CC) 60 MG 24 hr tablet Take 60 mg by mouth 2 (two) times daily.    . pantoprazole (PROTONIX) 40 MG tablet Take 40 mg by mouth 2 (two) times daily.    . Tamsulosin HCl (FLOMAX) 0.4 MG CAPS Take 0.4 mg by mouth 2 (two) times daily.      No current facility-administered medications for this visit.     Review of Systems  HENT: Negative.   Eyes: Negative.   Respiratory: Negative.   Cardiovascular: Negative.   Gastrointestinal: Negative.   Genitourinary: Negative.   Musculoskeletal: Positive for back pain and joint pain.       Knee problems  Skin: Negative.   Neurological: Positive for speech change (secondary to stroke) and weakness.       Left sided weakness secondary to stroke  Endo/Heme/Allergies: Negative.   Psychiatric/Behavioral: Negative.   All other systems reviewed and are negative.  14 point ROS was done and is otherwise as detailed above or in HPI   PHYSICAL EXAMINATION: ECOG PERFORMANCE STATUS: 1 - Symptomatic but completely ambulatory  Vitals:   03/17/16 1407  BP: 98/78  Pulse: 84  Resp: 18   Filed Weights   03/17/16 1407  Weight: 290 lb (131.5 kg)    Physical Exam  Constitutional: He is oriented to person, place, and time and well-developed, well-nourished, and in no distress.  Obese  HENT:  Head: Normocephalic and  atraumatic.  Mouth/Throat: Oropharynx is clear and moist. No oropharyngeal exudate.  Eyes: Conjunctivae and EOM are normal. Pupils are equal, round, and reactive to light. Right eye exhibits no discharge. Left eye exhibits no discharge. No scleral icterus.  Neck: Normal range of motion. Neck supple.  Cardiovascular: Normal rate, regular rhythm and normal heart sounds.   Pulmonary/Chest: Effort normal and breath sounds normal. No respiratory distress. He has no wheezes.  Abdominal: Soft. Bowel sounds are normal. He exhibits no distension. There is no tenderness. There is no rebound and no guarding.  Musculoskeletal: Normal range of motion. He exhibits no edema.  Lymphadenopathy:    He has no cervical adenopathy.  Neurological: He is alert and oriented to person, place, and time.  Skin: Skin is warm and dry.  Psychiatric: Affect and judgment normal.  Nursing note and vitals reviewed.   LABORATORY DATA:  I have reviewed the data as listed Lab Results  Component Value Date   WBC 6.6 03/17/2016   HGB 13.6 03/17/2016   HCT 40.8 03/17/2016   MCV 90.5 03/17/2016   PLT 236 03/17/2016   CMP     Component Value Date/Time   NA 139 03/17/2016 1505   K 3.6 03/17/2016 1505   CL 108 03/17/2016 1505   CO2 24 03/17/2016 1505   GLUCOSE 98 03/17/2016 1505   BUN 25 (H) 03/17/2016 1505   CREATININE 2.69 (H) 03/17/2016 1505   CALCIUM 9.5 03/17/2016 1505   PROT 7.1 03/17/2016 1505   ALBUMIN 4.1 03/17/2016 1505   AST 22 03/17/2016 1505   ALT 21 03/17/2016 1505   ALKPHOS 98 03/17/2016 1505   BILITOT 0.4 03/17/2016 1505   GFRNONAA 22 (L) 03/17/2016 1505   GFRAA 26 (L) 03/17/2016 1505     RADIOGRAPHIC STUDIES: I have personally reviewed the radiological images as listed and agreed with the findings in the report. No results found.  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  We briefly addressed MGUS and further evaluation.    Evaluation - The diagnosis of monoclonal gammopathy of undetermined significance (MGUS) is usually incidental when a monoclonal protein <3 g/dL is found as part of the evaluation of another disorder, such as unexplained age-inappropriate bone loss, unexplained proteinuria, an elevated total protein in the blood, or peripheral neuropathy without a defined etiology At a minimum, patients suspected of having MGUS should be evaluated with the following studies: ?Complete blood count ?Serum calcium and creatinine ?Serum protein electrophoresis and immunofixation ?Urine protein electrophoresis and immunofixation - The serum free light chain (FLC) assay can be used initially in place of urine studies. However, if a monoclonal protein is seen on serum studies or if the serum FLC ratio is abnormal, urine electrophoresis and immunofixation need to be performed. ?Serum FLC assay ?Quantitation of immunoglobulins ?Metastatic bone survey - A metastatic bone survey may be necessary for some patients with MGUS and in any patient where there is a concern for myeloma. This skeletal survey may be omitted in patients with low-risk MGUS (ie, IgG type MGUS with serum M protein <1.5 g/dL and a normal serum FLC ratio) and in patients with IgM MGUS with no clinical concern for bone lesions or myeloma. In patients with high-risk MGUS, whole body low dose CT or PET/CT scan may be considered if there is concern for the presence of bone disease based on clinical features, since these techniques are more sensitive in detecting bone disease.   A bone marrow aspiration and biopsy is indicated in all patients with an M protein ?1.5 g/dL, patients with IgA MGUS of any size, patients with an abnormal serum free light chain ratio (ie, ratio of kappa to lambda free light chains <0.26 or >1.65), and in all patients who have any abnormalities of the complete blood  count (CBC), serum creatinine, serum calcium, or radiographic bone survey. This practice is supported by a study of 1271 patients with MGUS or multiple myeloma with minimal bone  pain (grade 0/1) whose initial work-up included bone marrow evaluation and skeletal survey (without serum free light chain analysis) with the following results   ?Among patients with an M-protein ?1.5 g/dL, the percentage of patients with bone marrow plasma cell infiltration >10 percent was 7.3 percent overall, but ranged from 4.7 percent to 20 percent in those with IgG and IgA isotypes, respectively.  ?For those with an IgG M-protein <0.5 g/dL, <1 percent had bone marrow plasma cell infiltration >10 percent by morphology.  ?Similarly, among those with an M protein ?1.5 g/dL, the percentage of patients with bone lesions on skeletal survey was <2 percent, irrespective of isotype.  These results support the omission of the bone marrow evaluation in patients with IgG type MGUS with serum M protein <1.5 g/dL, a normal serum FLC ratio, and with no bone pain or clinical concern for myeloma. Bone marrow may also be deferred in older asymptomatic adults or in frail older adults with limited life expectancy in whom myeloma or a related malignancy is considered unlikely, who can be safely followed. Although data are not available to support this approach, we also often defer a bone marrow biopsy in patients with IgM MGUS with a small M protein (<1.5 g/dL), normal serum FLC ratio, with no evidence of anemia, lymphadenopathy, or organomegaly  Computerized tomography (CT), magnetic resonance imaging (MRI), and positron emission tomography (PET) are more sensitive than plain radiographs at detecting bone involvement by multiple myeloma; however, the clinical importance of asymptomatic bone lesions detected by these imaging modalities alone is not clear. For patients with non-IgM MGUS, we reserve CT, MRI, and PET/CT for select patients such as those  with bone pain without an abnormality on skeletal survey. CT scan of the chest and abdomen at the time of diagnosis to evaluate for the possibility of [place] macroglobulinemia should be considered in patients with an IgM monoclonal protein in whom there is clinical suspicion for hepatosplenomegaly or lymphadenopathy (including patients with unexplained anemia or relatively high M protein concentration >2 to 3 g/dL) [3]. Lymphadenopathy in the setting of an IgM monoclonal protein is suggestive of [place] macroglobulinemia.  Labs obtained today: Orders Placed This Encounter  Procedures  . DG Bone Survey Met    Standing Status:   Future    Standing Expiration Date:   05/18/2017    Order Specific Question:   Reason for Exam (SYMPTOM  OR DIAGNOSIS REQUIRED)    Answer:   MGUS    Order Specific Question:   Preferred imaging location?    Answer:   [facility]  . CBC with Differential  . Comprehensive metabolic panel  . Sedimentation rate  . Kappa/lambda light chains  . Beta 2 microglobuline, serum  . IgG, IgA, IgM  . Immunofixation electrophoresis  . Protein electrophoresis, serum   Outside labs reviewed.  He sees Dr. Woody Seller for primary care. He sees Dr. Diona Fanti in urology. He sees Dr. Lowanda Foster for nephrology care.   He will return for follow up in 2 to 3 weeks once all of his studies are back.   All questions were answered. The patient knows to call the clinic with any problems, questions or concerns.  This document serves as a record of services personally performed by Ancil Linsey, MD. It was created on her behalf by Arlyce Harman, a trained medical scribe. The creation of this record is based on the scribe's personal observations and the provider's statements to them. This document has been checked and approved by the attending  provider.  I have reviewed the above documentation for accuracy and completeness and I agree with the above.  This note was electronically  signed.    Molli Hazard, MD  03/19/2016 5:33 PM

## 2016-03-17 NOTE — Patient Instructions (Signed)
[place] at [facility] Discharge Instructions  RECOMMENDATIONS MADE BY THE CONSULTANT AND ANY TEST RESULTS WILL BE SENT TO YOUR REFERRING PHYSICIAN.  You saw Dr.Penland today. Return to clinic in 2 weeks for follow up.  Labs today. Myeloma survey to be scheduled. See Amy at checkout for appointments.  Thank you for choosing [place] at [facility] to provide your oncology and hematology care.  To afford each patient quality time with our provider, please arrive at least 15 minutes before your scheduled appointment time.   Beginning January 23rd 2017 lab work for the [facility] will be done in the  Main lab at [facility] on 1st floor. If you have a lab appointment with the [facility] please come in thru the  Main Entrance and check in at the main information desk  You need to re-schedule your appointment should you arrive 10 or more minutes late.  We strive to give you quality time with our providers, and arriving late affects you and other patients whose appointments are after yours.  Also, if you no show three or more times for appointments you may be dismissed from the clinic at the providers discretion.     Again, thank you for choosing [facility].  Our hope is that these requests will decrease the amount of time that you wait before being seen by our physicians.       _____________________________________________________________  Should you have questions after your visit to [facility], please contact our office at [phone] between the hours of 8:30 a.m. and 4:30 p.m.  Voicemails left after 4:30 p.m. will not be returned until the following business day.  For prescription refill requests, have your pharmacy contact our office.         Resources For Cancer Patients and their Caregivers ? American Cancer Society: Can assist with transportation, wigs, general needs, runs Look Good Feel Better.         843-089-9991 ? Cancer Care: Provides financial assistance, online support groups, medication/co-pay assistance.  1-800-813-HOPE 702-820-1095) ? [facility] Assists [place] cancer patients and their families through emotional , educational and financial support.  347-555-8757 ? [place] DSS Where to apply for food stamps, Medicaid and utility assistance. 501-668-1627 ? [facility]: Transportation to medical appointments. 936-235-3372 ? Social Security Administration: May apply for disability if have a Stage IV cancer. 445-614-7008 (308)051-9527 ? [employer], Disability and Transit Services: Assists with nutrition, care and transit needs. [place] Support Programs: @10RELATIVEDAYS @ > Cancer Support Group  2nd Tuesday of the month 1pm-2pm, Journey Room  > Creative Journey  3rd Tuesday of the month 1130am-1pm, Journey Room  > Look Good Feel Better  1st Wednesday of the month 10am-12 noon, Journey Room (Call [place] to register [phone])

## 2016-03-18 LAB — IGG, IGA, IGM
IGA: 60 mg/dL — AB (ref 61–437)
IgG (Immunoglobin G), Serum: 1037 mg/dL (ref 700–1600)
IgM, Serum: 47 mg/dL (ref 15–143)

## 2016-03-18 LAB — KAPPA/LAMBDA LIGHT CHAINS
KAPPA FREE LGHT CHN: 25.9 mg/L — AB (ref 3.3–19.4)
Kappa, lambda light chain ratio: 0.5 (ref 0.26–1.65)
LAMDA FREE LIGHT CHAINS: 51.6 mg/L — AB (ref 5.7–26.3)

## 2016-03-18 LAB — BETA 2 MICROGLOBULIN, SERUM: Beta-2 Microglobulin: 3.3 mg/L — ABNORMAL HIGH (ref 0.6–2.4)

## 2016-03-19 ENCOUNTER — Ambulatory Visit: Payer: Medicare Other | Admitting: Orthopedic Surgery

## 2016-03-19 ENCOUNTER — Encounter (HOSPITAL_COMMUNITY): Payer: Self-pay | Admitting: Hematology & Oncology

## 2016-03-19 LAB — IMMUNOFIXATION ELECTROPHORESIS
IGA: 56 mg/dL — AB (ref 61–437)
IGG (IMMUNOGLOBIN G), SERUM: 1064 mg/dL (ref 700–1600)
IgM, Serum: 43 mg/dL (ref 15–143)
Total Protein ELP: 6.7 g/dL (ref 6.0–8.5)

## 2016-03-20 LAB — PROTEIN ELECTROPHORESIS, SERUM
A/G Ratio: 1.3 (ref 0.7–1.7)
Albumin ELP: 3.9 g/dL (ref 2.9–4.4)
Alpha-1-Globulin: 0.2 g/dL (ref 0.0–0.4)
Alpha-2-Globulin: 0.6 g/dL (ref 0.4–1.0)
Beta Globulin: 0.9 g/dL (ref 0.7–1.3)
GAMMA GLOBULIN: 1.1 g/dL (ref 0.4–1.8)
Globulin, Total: 2.9 g/dL (ref 2.2–3.9)
M-Spike, %: 0.6 g/dL — ABNORMAL HIGH
Total Protein ELP: 6.8 g/dL (ref 6.0–8.5)

## 2016-03-27 ENCOUNTER — Ambulatory Visit (HOSPITAL_COMMUNITY)
Admission: RE | Admit: 2016-03-27 | Discharge: 2016-03-27 | Disposition: A | Discharge status: Home / Self Care | Payer: Medicare Other | Source: Ambulatory Visit | Attending: Hematology & Oncology | Admitting: Hematology & Oncology

## 2016-03-27 DIAGNOSIS — M5031 Other cervical disc degeneration,  high cervical region: Secondary | ICD-10-CM | POA: Insufficient documentation

## 2016-03-27 DIAGNOSIS — D472 Monoclonal gammopathy: Secondary | ICD-10-CM | POA: Diagnosis present

## 2016-03-28 ENCOUNTER — Ambulatory Visit (INDEPENDENT_AMBULATORY_CARE_PROVIDER_SITE_OTHER): Payer: Medicare Other

## 2016-03-28 ENCOUNTER — Encounter: Payer: Self-pay | Admitting: Orthopedic Surgery

## 2016-03-28 ENCOUNTER — Ambulatory Visit (INDEPENDENT_AMBULATORY_CARE_PROVIDER_SITE_OTHER): Payer: Medicare Other | Admitting: Orthopedic Surgery

## 2016-03-28 VITALS — BP 124/76 | HR 88 | Wt 293.0 lb

## 2016-03-28 DIAGNOSIS — M25562 Pain in left knee: Secondary | ICD-10-CM

## 2016-03-28 DIAGNOSIS — M1712 Unilateral primary osteoarthritis, left knee: Secondary | ICD-10-CM

## 2016-03-28 NOTE — Patient Instructions (Signed)

## 2016-03-28 NOTE — Progress Notes (Signed)
Patient ID: OSMAN CASE, male   DOB: 09/12/1940, 75 y.o.   MRN: [id number]  Chief Complaint  Patient presents with  . Knee Pain    left knee pain    HPI: 75 year old male presents with left knee pain. He is noted to have had a stroke and 89 requiring the use of a cane. He's had over one year history of medial sided left knee pain with a dull ache waxes and wanes in severity moderate to severe at times associated with decreased range of motion and difficulty weightbearing.  Previous treatment includes several injections  Review of Systems  Constitutional: Negative for chills, fever and weight loss.  Respiratory: Negative for shortness of breath.   Cardiovascular: Negative for chest pain.  Neurological: Negative for tingling.    Past Medical History:  Diagnosis Date  . Cerebral aneurysm   . Cerebrovascular disease 11/20/2014  . CKD (chronic kidney disease) stage 3, GFR 30-59 ml/min   . Hypercholesterolemia   . Hypertension   . Prostate cancer ([place])   . Stroke ([facility])     PHYSICAL EXAM  BP 124/76   Pulse 88   Wt 293 lb (132.9 kg)   BMI 36.62 kg/m  GENERAL appearance reveals no gross abnormalities  MENTAL STATUS we note that the patient is awake alert and oriented to person place and time MOOD/AFFECT ARE NORMAL   GAIT reveals Abnormal gait with cane and limping left knee and leg   EXAM OF THE Left KNEE SKIN no erythema lacerations or ecchymosis  INSPECTION tenderness and it's noted medially with no joint effusion ROM 10-1 15 STABILITY normal collateral ligaments and cruciate ligaments MOTOR GRADE 5/5    examination of the  right knee   right knee is acting more swollen than the left  VASC 2+ dorsalis pedis pulse normal capillary refill excellent warmth to the extremity  NEURO sensation and no pathologic reflexes  LYMPH deferred noncontributory   IMAGING STUDIES  I have independently reviewed the x-rays and I interpreted the x-rays as follows:  Fairly  significant amount of arthritis and left knee  Dx   primary osteoarthritis of the  left knee  PLAN  I recommended injection and bracing as the patient is undergoing workup for recurrent prostate cancer and he also has kidney/renal disease followed by Dr Shireen Quan and is not a good surgical candidate  Procedure note left knee injection verbal consent was obtained to inject left knee joint  Timeout was completed to confirm the site of injection  The medications used were 40 mg of Depo-Medrol and 1% lidocaine 3 cc  Anesthesia was provided by ethyl chloride and the skin was prepped with alcohol.  After cleaning the skin with alcohol a 20-gauge needle was used to inject the left knee joint. There were no complications. A sterile bandage was applied.    11:00 AM Arther Abbott, MD 03/28/2016

## 2016-03-31 ENCOUNTER — Ambulatory Visit (INDEPENDENT_AMBULATORY_CARE_PROVIDER_SITE_OTHER): Payer: Medicare Other | Admitting: Otolaryngology

## 2016-04-01 ENCOUNTER — Encounter (HOSPITAL_BASED_OUTPATIENT_CLINIC_OR_DEPARTMENT_OTHER): Payer: Medicare Other | Admitting: Oncology

## 2016-04-01 ENCOUNTER — Encounter (HOSPITAL_COMMUNITY): Payer: Self-pay | Admitting: Oncology

## 2016-04-01 VITALS — BP 150/70 | HR 84 | Temp 97.4°F | Resp 18 | Ht 75.0 in | Wt 289.0 lb

## 2016-04-01 DIAGNOSIS — R944 Abnormal results of kidney function studies: Secondary | ICD-10-CM | POA: Diagnosis not present

## 2016-04-01 DIAGNOSIS — Z720 Tobacco use: Secondary | ICD-10-CM | POA: Diagnosis not present

## 2016-04-01 DIAGNOSIS — D472 Monoclonal gammopathy: Secondary | ICD-10-CM | POA: Diagnosis not present

## 2016-04-01 DIAGNOSIS — N184 Chronic kidney disease, stage 4 (severe): Secondary | ICD-10-CM | POA: Diagnosis not present

## 2016-04-01 HISTORY — DX: Monoclonal gammopathy: D47.2

## 2016-04-01 NOTE — Assessment & Plan Note (Addendum)
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  Labs from 03/17/2016 are reviewed: CBC diff, CMET, ESR, SPEP+IFE, light chain assay, B2M.  I personally reviewed and went over laboratory results with the patient.  The results are noted within this dictation.  Labs demonstrate normal immunoglobulin levels, normal light chain ratio, M-Spike of 0.6, normal calcium, and normal CBC with Stage Stage IV renal disease   I personally reviewed and went over radiographic studies with the patient.  The results are noted within this dictation.  Skeletal survey is negative for any lytic lesions.  Given his abnormal serum creatinine, he is offered a bone marrow aspiration and biopsy.  A bone marrow aspiration and biopsy is indicated in all patients with an M protein ?1.5 g/dL, patients with IgA MGUS of any size, patients with an abnormal serum free light chain ratio (ie, ratio of kappa to lambda free light chains <0.26 or >1.65), and in all patients who have any abnormalities of the complete blood count (CBC), serum creatinine, serum calcium, or radiographic bone survey. This practice is supported by a study of 1271 patients with MGUS or multiple myeloma with minimal bone pain (grade 0/1) whose initial work-up included bone marrow evaluation and skeletal survey (without serum free light chain analysis) with the following results:   ?Among patients with an M-protein ?1.5 g/dL, the percentage of patients with bone marrow plasma cell infiltration >10 percent was 7.3 percent overall, but ranged from 4.7 percent to 20 percent in those with IgG and IgA isotypes, respectively.   ?For those with an IgG M-protein <0.5 g/dL, <1 percent had bone marrow plasma cell infiltration >10 percent by morphology.   ?Similarly, among those with an M protein ?1.5 g/dL, the percentage of patients with bone lesions on skeletal survey was <2 percent, irrespective of isotype.   These results support the omission of  the bone marrow evaluation in patients with IgG type MGUS with serum M protein <1.5 g/dL, a normal serum FLC ratio, and with no bone pain or clinical concern for myeloma. Bone marrow may also be deferred in older asymptomatic adults or in frail older adults with limited life expectancy in whom myeloma or a related malignancy is considered unlikely, who can be safely followed. Although data are not available to support this approach, we also often defer a bone marrow biopsy in patients with IgM MGUS with a small M protein (<1.5 g/dL), normal serum FLC ratio, with no evidence of anemia, lymphadenopathy, or organomegaly.   I went over the the procedure of a bone marrow aspiration and biopsy.  I explained that we prep and cleanse the skin to decrease the risk of infection.  We then numb the skin superficial to the PSIS.  This will likely cause a stinging sensation that is short-lived.  After numbing the skin, a tract all the way to the bone or PSIS is numbed.  After waiting a few moments for the lidocaine to take full effect, a needle is inserted through the numbed tract and through the bone.  Once through the bone, an aspirate is performed.  A "shock" sensation will be felt down the lower extremity of the side of the hip that the procedure is being performed on. This is caused by the negative pressure created by the pulling back of the syringe to obtain blood from the bone marrow.  When this blood is collected and the negative pressure is stopped, the "shock" sensation subsides.  Then a biopsy is  performed of the bone and its marrow.  The products from this procedure are then analyzed and the results are reported 1-2 weeks later. Following the procedure, a pressure bandage will be applied to the site and the patient will remain in the procedure room for 30 minutes. The patient will then be discharged.  The risks, benefits, and alternatives of the procedure were explained.  The risks include infection, pain, minimal  blood loss, and discomfort following the procedure.  The patient expresses understanding and asks appropriate questions.  All questions regarding the procedure were answered.  He is agreeable to bone marrow aspiration and biopsy.  This [facility] at [facility] with IR.  He would like it done after 04/22/2016 which is fine (he has other scheduled appointment).  Labs in 6 weeks: CBC diff, CMET.  Return in 6 weeks for follow-up to review bone marrow biopsy result and further planning for medical oncology surveillance.

## 2016-04-01 NOTE — Patient Instructions (Addendum)
[place] at [facility] Discharge Instructions  RECOMMENDATIONS MADE BY THE CONSULTANT AND ANY TEST RESULTS WILL BE SENT TO YOUR REFERRING PHYSICIAN.  You were seen today by Kirby Crigler PA-C. Bone marrow biopsy to be scheduled. Return in 6 weeks for labs and follow up.   Thank you for choosing [place] at [facility] to provide your oncology and hematology care.  To afford each patient quality time with our provider, please arrive at least 15 minutes before your scheduled appointment time.   Beginning January 23rd 2017 lab work for the [facility] will be done in the  Main lab at [facility] on 1st floor. If you have a lab appointment with the [facility] please come in thru the  Main Entrance and check in at the main information desk  You need to re-schedule your appointment should you arrive 10 or more minutes late.  We strive to give you quality time with our providers, and arriving late affects you and other patients whose appointments are after yours.  Also, if you no show three or more times for appointments you may be dismissed from the clinic at the providers discretion.     Again, thank you for choosing [facility].  Our hope is that these requests will decrease the amount of time that you wait before being seen by our physicians.       _____________________________________________________________  Should you have questions after your visit to [facility], please contact our office at [phone] between the hours of 8:30 a.m. and 4:30 p.m.  Voicemails left after 4:30 p.m. will not be returned until the following business day.  For prescription refill requests, have your pharmacy contact our office.         Resources For Cancer Patients and their Caregivers ? American Cancer Society: Can assist with transportation, wigs, general needs, runs Look Good Feel Better.        479-197-1156 ? Cancer  Care: Provides financial assistance, online support groups, medication/co-pay assistance.  1-800-813-HOPE 573-129-9645) ? [place] Assists [place] cancer patients and their families through emotional , educational and financial support.  (564)766-2104 ? [place] DSS Where to apply for food stamps, Medicaid and utility assistance. 715-013-0745 ? [facility]: Transportation to medical appointments. 973-754-8415 ? Social Security Administration: May apply for disability if have a Stage IV cancer. 563-009-9668 (930)754-6217 ? [employer], Disability and Transit Services: Assists with nutrition, care and transit needs. [place] Support Programs: @10RELATIVEDAYS @ > Cancer Support Group  2nd Tuesday of the month 1pm-2pm, Journey Room  > Creative Journey  3rd Tuesday of the month 1130am-1pm, Journey Room  > Look Good Feel Better  1st Wednesday of the month 10am-12 noon, Journey Room (Call [place] to register [phone])

## 2016-04-01 NOTE — Progress Notes (Signed)
Glenda Chroman, MD [place] [address]  MGUS (monoclonal gammopathy of unknown significance) - Plan: CT Biopsy  Chronic kidney disease (CKD), stage IV (severe) (HCC)  CURRENT THERAPY: Work-up  INTERVAL HISTORY: Ryan Rollins 75 y.o. 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   He is asymptomatic.  He denies any new pain.  He does reports a left arm numbness x 3 months.  He notes progression of this numbness.  I suspect this is secondary to his neck disease which is noted on plain films and on his bone survey recently.  He is hear to review his lab results.  Review of Systems  Constitutional: Negative.  Negative for chills, fever and weight loss.  HENT: Negative.   Eyes: Negative.   Respiratory: Negative.  Negative for cough.   Cardiovascular: Negative.  Negative for chest pain.  Gastrointestinal: Negative.  Negative for constipation, diarrhea, nausea and vomiting.  Genitourinary: Negative.   Musculoskeletal: Negative.   Skin: Negative.   Neurological: Negative.  Negative for weakness.  Endo/Heme/Allergies: Negative.   Psychiatric/Behavioral: Negative.     Past Medical History:  Diagnosis Date  . Cerebral aneurysm   . Cerebrovascular disease 11/20/2014  . CKD (chronic kidney disease) stage 3, GFR 30-59 ml/min   . Hypercholesterolemia   . Hypertension   . 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 Prostate cancer ([place])   . Stroke [facility])     Past Surgical History:  Procedure Laterality Date  . ANKLE SURGERY    . CHOLECYSTECTOMY    . prostate surgery  biopsy      Family History  Problem Relation Age of Onset  . Hypertension Mother   . Diabetes Mother   . Congestive Heart Failure Mother   . Hypertension Father     Social History   Social History  . Marital status: Married    Spouse name: N/A  . Number of children: N/A  . Years of education: N/A   Social History Main Topics  . Smoking status: Current Every Day  Smoker    Packs/day: 0.25    Years: 60.00    Types: Cigarettes  . Smokeless tobacco: Never Used     Comment: working on [employer]  . Alcohol use 0.0 oz/week     Comment: occ  . Drug use: No  . Sexual activity: No   Other Topics Concern  . None   Social History Narrative  . None     PHYSICAL EXAMINATION  ECOG PERFORMANCE STATUS: 1 - Symptomatic but completely ambulatory  Vitals:   04/01/16 1025  BP: (!) 150/70  Pulse: 84  Resp: 18  Temp: 97.4 F (36.3 C)    GENERAL:alert, no distress, well nourished, well developed, comfortable, cooperative, smiling and unaccompanied, not well educated. SKIN: skin color, texture, turgor are normal, no rashes or significant lesions HEAD: Normocephalic, No masses, lesions, tenderness or abnormalities EYES: normal, Conjunctiva are pink and non-injected EARS: External ears normal OROPHARYNX:lips, buccal mucosa, and tongue normal and mucous membranes are moist  NECK: supple, trachea midline LYMPH:  not examined BREAST:not examined LUNGS: clear to auscultation and percussion HEART: regular rate & rhythm, no murmurs, no gallops, S1 normal and S2 normal ABDOMEN:abdomen soft and normal bowel sounds BACK: Back symmetric, no curvature. EXTREMITIES:less then 2 second capillary refill, no joint deformities, effusion, or inflammation, no skin discoloration, no cyanosis  NEURO: alert & oriented x 3 with fluent speech, no focal motor/sensory deficits, gait normal  LABORATORY DATA: CBC    Component Value Date/Time   WBC 6.6 03/17/2016 1505   RBC 4.51 03/17/2016 1505   HGB 13.6 03/17/2016 1505   HCT 40.8 03/17/2016 1505   PLT 236 03/17/2016 1505   MCV 90.5 03/17/2016 1505   MCH 30.2 03/17/2016 1505   MCHC 33.3 03/17/2016 1505   RDW 15.2 03/17/2016 1505   LYMPHSABS 2.1 03/17/2016 1505   MONOABS 0.4 03/17/2016 1505   EOSABS 0.1 03/17/2016 1505   BASOSABS 0.0 03/17/2016 1505      Chemistry      Component Value Date/Time   NA 139  03/17/2016 1505   K 3.6 03/17/2016 1505   CL 108 03/17/2016 1505   CO2 24 03/17/2016 1505   BUN 25 (H) 03/17/2016 1505   CREATININE 2.69 (H) 03/17/2016 1505      Component Value Date/Time   CALCIUM 9.5 03/17/2016 1505   ALKPHOS 98 03/17/2016 1505   AST 22 03/17/2016 1505   ALT 21 03/17/2016 1505   BILITOT 0.4 03/17/2016 1505     Lab Results  Component Value Date   PROT 7.1 03/17/2016   ALBUMINELP 3.9 03/17/2016   A1GS 0.2 03/17/2016   A2GS 0.6 03/17/2016   BETS 0.9 03/17/2016   GAMS 1.1 03/17/2016   MSPIKE 0.6 (H) 03/17/2016   SPEI Comment 03/17/2016   SPECOM Comment 03/17/2016   IGGSERUM 1,037 03/17/2016   IGGSERUM 1,064 03/17/2016   IGA 60 (L) 03/17/2016   IGA 56 (L) 03/17/2016   IGMSERUM 47 03/17/2016   IGMSERUM 43 03/17/2016   KPAFRELGTCHN 25.9 (H) 03/17/2016   LAMBDASER 51.6 (H) 03/17/2016   KAPLAMBRATIO 0.50 03/17/2016     PENDING LABS:   RADIOGRAPHIC STUDIES:  Dg Neck Soft Tissue  Result Date: 03/07/2016 CLINICAL DATA:  Possible fish bone stuck in patient's throat. EXAM: NECK SOFT TISSUES - 1+ VIEW COMPARISON:  None. FINDINGS: No visualized radiopaque foreign body. There is no evidence of retropharyngeal soft tissue swelling or epiglottic enlargement. The cervical airway is unremarkable. 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 IMPRESSION: Negative. Electronically Signed   By: Aletta Edouard M.D.   On: 03/07/2016 13:29   Dg Bone Survey Met  Result Date: 03/27/2016 CLINICAL DATA:  Monoclonal gammopathy of unspecified source EXAM: METASTATIC BONE SURVEY COMPARISON:  None. FINDINGS: A lateral view the skull shows no bony abnormality. Views of both shoulders show the glenohumeral joint spaces to be normal. No lytic or blastic lesion is seen. Views of both humerus show no lytic or blastic lesion. The forearms are unremarkable. Cervical vertebrae are straightened in alignment. There is  degenerative disc disease at C3-4, C5-6 and C6-7 levels. No compression deformity is seen. The thoracic vertebrae are normal alignment no lytic or blastic lesion. Mild compression of T6 is noted of uncertain age. Intervertebral disc spaces are unremarkable. The lumbar vertebrae are straightened in alignment. There are diffuse degenerative changes. No lytic or blastic lesion is seen. The lungs are clear. Mediastinal and hilar contours are unremarkable. The heart is within normal limits in size. A view of the pelvis shows degenerative change in the hips right greater than left. No lytic or blastic lesion is seen. Views of the femurs show no lytic or blastic lesion. There is degenerative change particular the right knee with loss of joint space laterally. Views of both lower legs show screws within the distal fibula and partial fusion of the distal left fibula with distal right tibia  but no lytic or blastic lesion is seen. IMPRESSION: 1. No lytic or blastic lesion is seen. 2. Mild compression deformity of T6 of uncertain age. 3. 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 Degenerative change diffusely throughout the thoracic and lumbar spine. 4. No active lung disease. Electronically Signed   By: Ivar Drape M.D.   On: 03/27/2016 14:35   Dg Knee Ap/lat W/sunrise Left  Result Date: 03/28/2016 3 views left knee Left knee pain Both joints have mild to moderate joint space narrowing primarily lateral. There are no major secondary bone changes noted. 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  Labs from 03/17/2016 are reviewed: CBC diff, CMET, ESR, SPEP+IFE, light chain assay, B2M.  I personally reviewed and went over laboratory results with the patient.  The results are  noted within this dictation.  Labs demonstrate normal immunoglobulin levels, normal light chain ratio, M-Spike of 0.6, normal calcium, and normal CBC with Stage Stage IV renal disease   I personally reviewed and went over radiographic studies with the patient.  The results are noted within this dictation.  Skeletal survey is negative for any lytic lesions.  Given his abnormal serum creatinine, he is offered a bone marrow aspiration and biopsy.  A bone marrow aspiration and biopsy is indicated in all patients with an M protein ?1.5 g/dL, patients with IgA MGUS of any size, patients with an abnormal serum free light chain ratio (ie, ratio of kappa to lambda free light chains <0.26 or >1.65), and in all patients who have any abnormalities of the complete blood count (CBC), serum creatinine, serum calcium, or radiographic bone survey. This practice is supported by a study of 1271 patients with MGUS or multiple myeloma with minimal bone pain (grade 0/1) whose initial work-up included bone marrow evaluation and skeletal survey (without serum free light chain analysis) with the following results:   ?Among patients with an M-protein ?1.5 g/dL, the percentage of patients with bone marrow plasma cell infiltration >10 percent was 7.3 percent overall, but ranged from 4.7 percent to 20 percent in those with IgG and IgA isotypes, respectively.   ?For those with an IgG M-protein <0.5 g/dL, <1 percent had bone marrow plasma cell infiltration >10 percent by morphology.   ?Similarly, among those with an M protein ?1.5 g/dL, the percentage of patients with bone lesions on skeletal survey was <2 percent, irrespective of isotype.   These results support the omission of the bone marrow evaluation in patients with IgG type MGUS with serum M protein <1.5 g/dL, a normal serum FLC ratio, and with no bone pain or clinical concern for myeloma. Bone marrow may also be deferred in older asymptomatic adults or in frail older adults  with limited life expectancy in whom myeloma or a related malignancy is considered unlikely, who can be safely followed. Although data are not available to support this approach, we also often defer a bone marrow biopsy in patients with IgM MGUS with a small M protein (<1.5 g/dL), normal serum FLC ratio, with no evidence of anemia, lymphadenopathy, or organomegaly.   I went over the the procedure of a bone marrow aspiration and biopsy.  I explained that we prep and cleanse the skin to decrease the risk of infection.  We then numb the skin superficial  to the PSIS.  This will likely cause a stinging sensation that is short-lived.  After numbing the skin, a tract all the way to the bone or PSIS is numbed.  After waiting a few moments for the lidocaine to take full effect, a needle is inserted through the numbed tract and through the bone.  Once through the bone, an aspirate is performed.  A "shock" sensation will be felt down the lower extremity of the side of the hip that the procedure is being performed on. This is caused by the negative pressure created by the pulling back of the syringe to obtain blood from the bone marrow.  When this blood is collected and the negative pressure is stopped, the "shock" sensation subsides.  Then a biopsy is performed of the bone and its marrow.  The products from this procedure are then analyzed and the results are reported 1-2 weeks later. Following the procedure, a pressure bandage will be applied to the site and the patient will remain in the procedure room for 30 minutes. The patient will then be discharged.  The risks, benefits, and alternatives of the procedure were explained.  The risks include infection, pain, minimal blood loss, and discomfort following the procedure.  The patient expresses understanding and asks appropriate questions.  All questions regarding the procedure were answered.  He is agreeable to bone marrow aspiration and biopsy.  This will be performed at  [facility] with IR.  He would like it done after 04/22/2016 which is fine (he has other scheduled appointment).  Labs in 6 weeks: CBC diff, CMET.  Return in 6 weeks for follow-up to review bone marrow biopsy result and further planning for medical oncology surveillance.    ORDERS PLACED FOR THIS ENCOUNTER: Orders Placed This Encounter  Procedures  . CT Biopsy    MEDICATIONS PRESCRIBED THIS ENCOUNTER: Meds ordered this encounter  Medications  . fluticasone (FLONASE) 50 MCG/ACT nasal spray  . HYDROcodone-acetaminophen (NORCO) 7.5-325 MG tablet  . hydrOXYzine (ATARAX/VISTARIL) 25 MG tablet    THERAPY PLAN:  Bone marrow aspiration and biopsy is recommended.    All questions were answered. The patient knows to call the clinic with any problems, questions or concerns. We can certainly see the patient much sooner if necessary.  Patient and plan discussed with Dr. Ancil Linsey and she is in agreement with the aforementioned.   This note is electronically signed by: Doy Mince 04/01/2016 10:41 AM

## 2016-04-22 ENCOUNTER — Ambulatory Visit (HOSPITAL_COMMUNITY): Payer: Medicare Other

## 2016-05-01 ENCOUNTER — Other Ambulatory Visit: Payer: Self-pay | Admitting: Radiology

## 2016-05-05 ENCOUNTER — Encounter (HOSPITAL_COMMUNITY): Payer: Self-pay

## 2016-05-05 ENCOUNTER — Other Ambulatory Visit (HOSPITAL_COMMUNITY): Payer: Self-pay | Admitting: Oncology

## 2016-05-05 ENCOUNTER — Ambulatory Visit (HOSPITAL_COMMUNITY)
Admission: RE | Admit: 2016-05-05 | Discharge: 2016-05-05 | Disposition: A | Discharge status: Home / Self Care | Payer: Medicare Other | Source: Ambulatory Visit | Attending: Oncology | Admitting: Oncology

## 2016-05-05 DIAGNOSIS — F17210 Nicotine dependence, cigarettes, uncomplicated: Secondary | ICD-10-CM | POA: Insufficient documentation

## 2016-05-05 DIAGNOSIS — N183 Chronic kidney disease, stage 3 (moderate): Secondary | ICD-10-CM | POA: Diagnosis not present

## 2016-05-05 DIAGNOSIS — Z8546 Personal history of malignant neoplasm of prostate: Secondary | ICD-10-CM | POA: Diagnosis not present

## 2016-05-05 DIAGNOSIS — D472 Monoclonal gammopathy: Secondary | ICD-10-CM

## 2016-05-05 DIAGNOSIS — E7800 Pure hypercholesterolemia, unspecified: Secondary | ICD-10-CM | POA: Insufficient documentation

## 2016-05-05 DIAGNOSIS — Z8673 Personal history of transient ischemic attack (TIA), and cerebral infarction without residual deficits: Secondary | ICD-10-CM | POA: Insufficient documentation

## 2016-05-05 DIAGNOSIS — I129 Hypertensive chronic kidney disease with stage 1 through stage 4 chronic kidney disease, or unspecified chronic kidney disease: Secondary | ICD-10-CM | POA: Diagnosis not present

## 2016-05-05 DIAGNOSIS — Z880 Allergy status to penicillin: Secondary | ICD-10-CM | POA: Diagnosis not present

## 2016-05-05 LAB — BASIC METABOLIC PANEL
ANION GAP: 8 (ref 5–15)
BUN: 35 mg/dL — ABNORMAL HIGH (ref 6–20)
CHLORIDE: 108 mmol/L (ref 101–111)
CO2: 23 mmol/L (ref 22–32)
CREATININE: 2.41 mg/dL — AB (ref 0.61–1.24)
Calcium: 9.1 mg/dL (ref 8.9–10.3)
GFR calc Af Amer: 29 mL/min — ABNORMAL LOW (ref >60)
GFR calc non Af Amer: 25 mL/min — ABNORMAL LOW (ref >60)
GLUCOSE: 113 mg/dL — AB (ref 65–99)
Potassium: 3.6 mmol/L (ref 3.5–5.1)
Sodium: 139 mmol/L (ref 135–145)

## 2016-05-05 LAB — PROTIME-INR
INR: 0.97
Prothrombin Time: 12.9 seconds (ref 11.4–15.2)

## 2016-05-05 LAB — CBC WITH DIFFERENTIAL/PLATELET
Basophils Absolute: 0 10*3/uL (ref 0.0–0.1)
Basophils Relative: 0 %
Eosinophils Absolute: 0.1 10*3/uL (ref 0.0–0.7)
Eosinophils Relative: 2 %
HEMATOCRIT: 37.7 % — AB (ref 39.0–52.0)
Hemoglobin: 12.4 g/dL — ABNORMAL LOW (ref 13.0–17.0)
LYMPHS ABS: 1.6 10*3/uL (ref 0.7–4.0)
Lymphocytes Relative: 26 %
MCH: 29.5 pg (ref 26.0–34.0)
MCHC: 32.9 g/dL (ref 30.0–36.0)
MCV: 89.8 fL (ref 78.0–100.0)
MONO ABS: 0.5 10*3/uL (ref 0.1–1.0)
MONOS PCT: 9 %
NEUTROS ABS: 3.9 10*3/uL (ref 1.7–7.7)
NEUTROS PCT: 63 %
Platelets: 239 10*3/uL (ref 150–400)
RBC: 4.2 MIL/uL — ABNORMAL LOW (ref 4.22–5.81)
RDW: 15.3 % (ref 11.5–15.5)
WBC: 6.2 10*3/uL (ref 4.0–10.5)

## 2016-05-05 LAB — BONE MARROW EXAM

## 2016-05-05 MED ORDER — FLUMAZENIL 0.5 MG/5ML IV SOLN
INTRAVENOUS | Status: AC
  Filled 2016-05-05: qty 5

## 2016-05-05 MED ORDER — ONDANSETRON HCL 4 MG/2ML IJ SOLN
INTRAMUSCULAR | Status: AC
  Administered 2016-05-05: 4 mg
  Filled 2016-05-05: qty 2

## 2016-05-05 MED ORDER — NALOXONE HCL 0.4 MG/ML IJ SOLN
INTRAMUSCULAR | Status: AC
  Filled 2016-05-05: qty 1

## 2016-05-05 MED ORDER — FENTANYL CITRATE (PF) 100 MCG/2ML IJ SOLN
INTRAMUSCULAR | Status: AC
  Filled 2016-05-05: qty 4

## 2016-05-05 MED ORDER — MIDAZOLAM HCL 2 MG/2ML IJ SOLN
INTRAMUSCULAR | Status: AC | PRN
  Administered 2016-05-05 (×3): 1 mg via INTRAVENOUS

## 2016-05-05 MED ORDER — SODIUM CHLORIDE 0.9 % IV SOLN
INTRAVENOUS | Status: DC
  Administered 2016-05-05: 11:00:00 via INTRAVENOUS

## 2016-05-05 MED ORDER — MIDAZOLAM HCL 2 MG/2ML IJ SOLN
INTRAMUSCULAR | Status: AC
  Filled 2016-05-05: qty 4

## 2016-05-05 MED ORDER — FENTANYL CITRATE (PF) 100 MCG/2ML IJ SOLN
INTRAMUSCULAR | Status: AC | PRN
  Administered 2016-05-05 (×3): 50 ug via INTRAVENOUS

## 2016-05-05 NOTE — Procedures (Signed)
CT-guided LEFT iliac bone marrow aspiration and core biopsy No complication No blood loss. See complete dictation in [facility] PACS  

## 2016-05-05 NOTE — Discharge Instructions (Signed)
Moderate Conscious Sedation, Adult, Care After These instructions provide you with information about caring for yourself after your procedure. Your health care provider may also give you more specific instructions. Your treatment has been planned according to current medical practices, but problems sometimes occur. Call your health care provider if you have any problems or questions after your procedure. What can I expect after the procedure? After your procedure, it is common:  To feel sleepy for several hours.  To feel clumsy and have poor balance for several hours.  To have poor judgment for several hours.  To vomit if you eat too soon. Follow these instructions at home: For at least 24 hours after the procedure:   Do not:  Participate in activities where you could fall or become injured.  Drive.  Use heavy machinery.  Drink alcohol.  Take sleeping pills or medicines that cause drowsiness.  Make important decisions or sign legal documents.  Take care of children on your own.  Rest. Eating and drinking  Follow the diet recommended by your health care provider.  If you vomit:  Drink water, juice, or soup when you can drink without vomiting.  Make sure you have little or no nausea before eating solid foods. General instructions  Have a responsible adult stay with you until you are awake and alert.  Take over-the-counter and prescription medicines only as told by your health care provider.  If you smoke, do not smoke without supervision.  Keep all follow-up visits as told by your health care provider. This is important. Contact a health care provider if:  You keep feeling nauseous or you keep vomiting.  You feel light-headed.  You develop a rash.  You have a fever. Get help right away if:  You have trouble breathing. This information is not intended to replace advice given to you by your health care provider. Make sure you discuss any questions you have  with your health care provider. Document Released: 01/19/2013 Document Revised: 09/03/2015 Document Reviewed: 07/21/2015 Elsevier Interactive Patient Education  2017 [place].   Bone Marrow Aspiration and Bone Marrow Biopsy, Adult, Care After This sheet gives you information about how to care for yourself after your procedure. Your health care provider may also give you more specific instructions. If you have problems or questions, contact your health care provider. What can I expect after the procedure? After the procedure, it is common to have:  Mild pain and tenderness.  Swelling.  Bruising. Follow these instructions at home:  Take over-the-counter or prescription medicines only as told by your health care provider.  Do not take baths, swim, or use a hot tub until your health care provider approves. Ask if you can take a shower or have a sponge bath.  Follow instructions from your health care provider about how to take care of the puncture site. Make sure you:  Wash your hands with soap and water before you change your bandage (dressing). If soap and water are not available, use hand sanitizer.  Change your dressing as told by your health care provider.  Check your puncture siteevery day for signs of infection. Check for:  More redness, swelling, or pain.  More fluid or blood.  Warmth.  Pus or a bad smell.  Return to your normal activities as told by your health care provider. Ask your health care provider what activities are safe for you.  Do not drive for 24 hours if you were given a medicine to help you relax (sedative).  Keep all follow-up visits as told by your health care provider. This is important. °Contact a health care provider if: °· You have more redness, swelling, or pain around the puncture site. °· You have more fluid or blood coming from the puncture site. °· Your puncture site feels warm to the touch. °· You have pus or a bad smell coming from the  puncture site. °· You have a fever. °· Your pain is not controlled with medicine. °This information is not intended to replace advice given to you by your health care provider. Make sure you discuss any questions you have with your health care provider. °Document Released: 10/18/2004 Document Revised: 10/19/2015 Document Reviewed: 09/12/2015 °Elsevier Interactive Patient Education © 2017 Elsevier Inc. ° °

## 2016-05-05 NOTE — H&P (Signed)
Chief Complaint: MGUS  Referring Physician:Dr. Ancil Rollins  Supervising Physician: Ryan Rollins  Patient Status: [facility] - Out-pt  HPI: Ryan Rollins is an 76 y.o. male who is followed by Ryan Rollins for CKD.  He was noted in early December to have an M spike and was sent to Ryan Rollins.  He has been worked up and felt to need a bone marrow biopsy for further evaluation for possible MGUS.  He denies any recent change in his medical history such as CP, SOB, Fevers, or HA.  He does have a history of intermittent weakness in his bilateral extremities from prior CVAs.  Otherwise, he has no new complaints.   Past Medical History:  Past Medical History:  Diagnosis Date  . Cerebral aneurysm   . Cerebrovascular disease 11/20/2014  . CKD (chronic kidney disease) stage 3, GFR 30-59 ml/min   . Hypercholesterolemia   . Hypertension   . 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 Prostate cancer ([place])   . Stroke [facility])     Past Surgical History:  Past Surgical History:  Procedure Laterality Date  . ANKLE SURGERY    . CHOLECYSTECTOMY    . prostate surgery  biopsy      Family History:  Family History  Problem Relation Age of Onset  . Hypertension Mother   . Diabetes Mother   . Congestive Heart Failure Mother   . Hypertension Father     Social History:  reports that he has been smoking Cigarettes.  He has a 15.00 pack-year smoking history. He has never used smokeless tobacco. He reports that he drinks alcohol. He reports that he does not use drugs.  Allergies:  Allergies  Allergen Reactions  . Morphine And Related Hives  . Penicillins Hives    Medications: Medications reviewed in epic.  He does take Plavix.  He stopped this yesterday for a prostate biopsy next week.  Please HPI for pertinent positives, otherwise complete 10 system ROS negative.  Mallampati Score: MD Evaluation Airway: WNL Heart: WNL Abdomen: WNL Chest/ Lungs: WNL ASA   Classification: 3 Mallampati/Airway Score: Two  Physical Exam: Temp (F)   98.2  98.2 (36.8)  01/22 0907  Pulse Rate   85  85  01/22 0907  Resp   20  20  01/22 0907  BP   119/77  119/77  01/22 0907  SpO2 (%)   100  100  01/22 0907   General: pleasant, obese black male who is laying in bed in NAD HEENT: head is normocephalic, atraumatic.  Sclera are noninjected.  PERRL.  Ears and nose without any masses or lesions.  Mouth is pink and moist Heart: regular, rate, and rhythm.  Normal s1,s2. No obvious murmurs, gallops, or rubs noted.  Palpable radial pulses bilaterally Lungs: CTAB, no wheezes, rhonchi, or rales noted.  Respiratory effort nonlabored Abd: soft, NT, ND, +BS, no masses, hernias, or organomegaly Psych: A&Ox3 with an appropriate affect.   Labs: Labs pending  Imaging: No results found.  Assessment/Plan 1. MGUS -will plan on bone marrow biopsy today to help with assistance in diagnosis and management. -labs are pending -vitals have been reviewed -Risks and Benefits discussed with the patient including, but not limited to bleeding, infection, damage to adjacent structures or low yield requiring additional tests. All of the patient's questions were answered, patient is agreeable to proceed. Consent signed and in chart.  Thank you for this interesting consult.  I greatly enjoyed meeting Ryan Rollins  and look forward to participating in their care.  A copy of this report was sent to the requesting provider on this date.  Electronically Signed: Henreitta Rollins 05/05/2016, 9:42 AM   I spent a total of  30 Minutes  in face to face in clinical consultation, greater than 50% of which was counseling/coordinating care for MGUS

## 2016-05-13 ENCOUNTER — Ambulatory Visit (HOSPITAL_COMMUNITY)
Admission: RE | Admit: 2016-05-13 | Discharge: 2016-05-13 | Disposition: A | Discharge status: Home / Self Care | Payer: Medicare Other | Source: Ambulatory Visit | Attending: Urology | Admitting: Urology

## 2016-05-13 ENCOUNTER — Other Ambulatory Visit (HOSPITAL_COMMUNITY): Payer: Medicare Other

## 2016-05-13 ENCOUNTER — Ambulatory Visit (HOSPITAL_COMMUNITY): Admission: RE | Admit: 2016-05-13 | Payer: Medicare Other | Source: Ambulatory Visit

## 2016-05-13 ENCOUNTER — Ambulatory Visit (HOSPITAL_COMMUNITY): Payer: Medicare Other | Admitting: Hematology & Oncology

## 2016-05-13 LAB — CHROMOSOME ANALYSIS, BONE MARROW

## 2016-05-13 LAB — TISSUE HYBRIDIZATION (BONE MARROW)-NCBH

## 2016-05-13 NOTE — Discharge Instructions (Signed)
Transrectal Ultrasound-Guided Biopsy A transrectal ultrasound-guided biopsy is a procedure to remove samples of tissue from your prostate using ultrasound images to guide the procedure. The procedure is usually done to evaluate the prostate gland of men who have an elevated prostate-specific antigen (PSA). PSA is a blood test to screen for prostate cancer. The biopsy samples are taken to check for prostate cancer.  LET [facility] CARE PROVIDER KNOW ABOUT:  Any allergies you have.  All medicines you are taking, including vitamins, herbs, eye drops, creams, and over-the-counter medicines.  Previous problems you or members of your family have had with the use of anesthetics.  Any blood disorders you have.  Previous surgeries you have had.  Medical conditions you have. RISKS AND COMPLICATIONS Generally, this is a safe procedure. However, as with any procedure, problems can occur. Possible problems include:  Infection of your prostate.  Bleeding from your rectum or blood in your urine.  Difficulty urinating.  Nerve damage (this is usually temporary).  Damage to surrounding structures such as blood vessels, organs, and muscles, which would require other procedures. BEFORE THE PROCEDURE  Do not  eat or drink anything after midnight on the night before the procedure or as directed by your health care provider.  Take medicines only as directed by your health care provider.  Your health care provider may have you stop taking certain medicines 5-7 days before the procedure.  You will be given an enema before the procedure. During an enema, a liquid is injected into your rectum to clear out waste.  You may have lab tests the day of your procedure.   Plan to have someone take you home after the procedure. PROCEDURE   You will be given medicine to help you relax (sedative) before the procedure. An IV tube will be inserted into one of your veins and used to give fluids and  medicine.  You will be given antibiotic medicine to reduce the risk of an infection.  You will be placed on your side for the procedure.  A probe with lubricated gel will be placed into your rectum, and images will be taken of your prostate and surrounding structures.  Numbing medicine will be injected into the prostate before the biopsy samples are taken.  A biopsy needle will then be inserted and guided to your prostate with the use of the ultrasound images.  Samples of prostate tissue will be taken, and the needle will then be removed.  The biopsy samples will be sent to a lab to be analyzed. Results are usually back in 2-3 days. AFTER THE PROCEDURE  You will be taken to a recovery area where you will be monitored.  You may have some discomfort in the rectal area. You will be given pain medicines to control this.  You may be allowed to go home the same day, or you may need to stay in the hospital overnight. This information is not intended to replace advice given to you by your health care provider. Make sure you discuss any questions you have with your health care provider. Document Released: 08/15/2013 Document Revised: 04/21/2014 Document Reviewed: 08/15/2013 Elsevier Interactive Patient Education  2017 [employer].

## 2016-05-15 ENCOUNTER — Encounter (HOSPITAL_COMMUNITY): Payer: Self-pay

## 2016-05-23 ENCOUNTER — Other Ambulatory Visit (HOSPITAL_COMMUNITY): Payer: Self-pay | Admitting: *Deleted

## 2016-05-23 DIAGNOSIS — D472 Monoclonal gammopathy: Secondary | ICD-10-CM

## 2016-05-26 ENCOUNTER — Other Ambulatory Visit (HOSPITAL_COMMUNITY): Payer: Medicare Other

## 2016-05-26 ENCOUNTER — Ambulatory Visit (HOSPITAL_COMMUNITY): Payer: Medicare Other

## 2016-06-03 ENCOUNTER — Ambulatory Visit (HOSPITAL_COMMUNITY)
Admission: RE | Admit: 2016-06-03 | Discharge: 2016-06-03 | Disposition: A | Discharge status: Home / Self Care | Payer: Medicare Other | Source: Ambulatory Visit | Attending: Urology | Admitting: Urology

## 2016-06-03 DIAGNOSIS — R972 Elevated prostate specific antigen [PSA]: Secondary | ICD-10-CM

## 2016-06-03 DIAGNOSIS — Z8546 Personal history of malignant neoplasm of prostate: Secondary | ICD-10-CM | POA: Diagnosis present

## 2016-06-03 MED ORDER — GENTAMICIN SULFATE 40 MG/ML IJ SOLN
160.0000 mg | Freq: Once | INTRAMUSCULAR | Status: AC
  Administered 2016-06-03: 160 mg via INTRAMUSCULAR

## 2016-06-03 MED ORDER — GENTAMICIN SULFATE 40 MG/ML IJ SOLN
INTRAMUSCULAR | Status: AC
  Administered 2016-06-03: 160 mg via INTRAMUSCULAR
  Filled 2016-06-03: qty 4

## 2016-06-03 MED ORDER — LIDOCAINE HCL (PF) 2 % IJ SOLN
10.0000 mL | Freq: Once | INTRAMUSCULAR | Status: AC
  Administered 2016-06-03: 10 mL

## 2016-06-03 MED ORDER — LIDOCAINE HCL (PF) 2 % IJ SOLN
INTRAMUSCULAR | Status: AC
  Administered 2016-06-03: 10 mL
  Filled 2016-06-03: qty 10

## 2016-06-03 NOTE — Discharge Instructions (Signed)
Transrectal Ultrasound-Guided Biopsy °A transrectal ultrasound-guided biopsy is a procedure to take samples of tissue from your prostate. Ultrasound images are used to guide the procedure. It is usually done to check the prostate gland for cancer. °What happens before the procedure? °· Do not eat or drink after midnight on the night before your procedure. °· Take medicines as your doctor tells you. °· Your doctor may have you stop taking some medicines 5-7 days before the procedure. °· You will be given an enema before your procedure. During an enema, a liquid is put into your butt (rectum) to clear out waste. °· You may have lab tests the day of your procedure. °· Make plans to have someone drive you home. °What happens during the procedure? °· You will be given medicine to help you relax before the procedure. An IV tube will be put into one of your veins. It will be used to give fluids and medicine. °· You will be given medicine to reduce the risk of infection (antibiotic). °· You will be placed on your side. °· A probe with gel will be put in your butt. This is used to take pictures of your prostate and the area around it. °· A medicine to numb the area is put into your prostate. °· A biopsy needle is then inserted and guided to your prostate. °· Samples of prostate tissue are taken. The needle is removed. °· The samples are sent to a lab to be checked. Results are usually back in 2-3 days. °What happens after the procedure? °· You will be taken to a room where you will be watched until you are doing okay. °· You may have some pain in the area around your butt. You will be given medicines for this. °· You may be able to go home the same day. Sometimes, an overnight stay in the hospital is needed. °This information is not intended to replace advice given to you by your health care provider. Make sure you discuss any questions you have with your health care provider. °Document Released: 03/19/2009 Document Revised:  09/06/2015 Document Reviewed: 11/17/2012 °Elsevier Interactive Patient Education © 2017 Elsevier Inc. ° °

## 2016-06-25 ENCOUNTER — Encounter (HOSPITAL_COMMUNITY): Payer: Medicare Other | Attending: Oncology | Admitting: Oncology

## 2016-06-25 ENCOUNTER — Encounter (HOSPITAL_COMMUNITY): Payer: Self-pay

## 2016-06-25 ENCOUNTER — Encounter (HOSPITAL_COMMUNITY): Payer: Medicare Other

## 2016-06-25 VITALS — BP 140/80 | HR 76 | Temp 97.6°F | Resp 20 | Wt 303.2 lb

## 2016-06-25 DIAGNOSIS — Z720 Tobacco use: Secondary | ICD-10-CM

## 2016-06-25 DIAGNOSIS — D472 Monoclonal gammopathy: Secondary | ICD-10-CM | POA: Diagnosis not present

## 2016-06-25 DIAGNOSIS — Z8546 Personal history of malignant neoplasm of prostate: Secondary | ICD-10-CM

## 2016-06-25 DIAGNOSIS — N183 Chronic kidney disease, stage 3 (moderate): Secondary | ICD-10-CM

## 2016-06-25 LAB — CBC WITH DIFFERENTIAL/PLATELET
BASOS ABS: 0 10*3/uL (ref 0.0–0.1)
Basophils Relative: 0 %
Eosinophils Absolute: 0.1 10*3/uL (ref 0.0–0.7)
Eosinophils Relative: 3 %
HEMATOCRIT: 41.4 % (ref 39.0–52.0)
Hemoglobin: 14.1 g/dL (ref 13.0–17.0)
LYMPHS PCT: 28 %
Lymphs Abs: 1.5 10*3/uL (ref 0.7–4.0)
MCH: 30.9 pg (ref 26.0–34.0)
MCHC: 34.1 g/dL (ref 30.0–36.0)
MCV: 90.6 fL (ref 78.0–100.0)
MONO ABS: 0.4 10*3/uL (ref 0.1–1.0)
MONOS PCT: 7 %
NEUTROS ABS: 3.4 10*3/uL (ref 1.7–7.7)
Neutrophils Relative %: 62 %
PLATELETS: 214 10*3/uL (ref 150–400)
RBC: 4.57 MIL/uL (ref 4.22–5.81)
RDW: 14.5 % (ref 11.5–15.5)
WBC: 5.4 10*3/uL (ref 4.0–10.5)

## 2016-06-25 LAB — COMPREHENSIVE METABOLIC PANEL
ALT: 18 U/L (ref 17–63)
ANION GAP: 6 (ref 5–15)
AST: 20 U/L (ref 15–41)
Albumin: 4 g/dL (ref 3.5–5.0)
Alkaline Phosphatase: 96 U/L (ref 38–126)
BILIRUBIN TOTAL: 0.5 mg/dL (ref 0.3–1.2)
BUN: 36 mg/dL — AB (ref 6–20)
CHLORIDE: 106 mmol/L (ref 101–111)
CO2: 25 mmol/L (ref 22–32)
Calcium: 9.3 mg/dL (ref 8.9–10.3)
Creatinine, Ser: 2.99 mg/dL — ABNORMAL HIGH (ref 0.61–1.24)
GFR, EST AFRICAN AMERICAN: 22 mL/min — AB (ref >60)
GFR, EST NON AFRICAN AMERICAN: 19 mL/min — AB (ref >60)
Glucose, Bld: 77 mg/dL (ref 65–99)
POTASSIUM: 4 mmol/L (ref 3.5–5.1)
Sodium: 137 mmol/L (ref 135–145)
TOTAL PROTEIN: 7.2 g/dL (ref 6.5–8.1)

## 2016-06-25 LAB — SEDIMENTATION RATE: SED RATE: 8 mm/h (ref 0–16)

## 2016-06-25 NOTE — Patient Instructions (Signed)
[facility] at [facility] Discharge Instructions  RECOMMENDATIONS MADE BY THE CONSULTANT AND ANY TEST RESULTS WILL BE SENT TO YOUR REFERRING PHYSICIAN.  You were seen today by Dr. Louise Zhou Follow up in 3 months with lab work See Amy up front for appointments   Thank you for choosing [facility] at [facility] to provide your oncology and hematology care.  To afford each patient quality time with our provider, please arrive at least 15 minutes before your scheduled appointment time.    If you have a lab appointment with the Cancer Center please come in thru the  Main Entrance and check in at the main information desk  You need to re-schedule your appointment should you arrive 10 or more minutes late.  We strive to give you quality time with our providers, and arriving late affects you and other patients whose appointments are after yours.  Also, if you no show three or more times for appointments you may be dismissed from the clinic at the providers discretion.     Again, thank you for choosing [facility].  Our hope is that these requests will decrease the amount of time that you wait before being seen by our physicians.       _____________________________________________________________  Should you have questions after your visit to [facility], please contact our office at [phone] between the hours of 8:30 a.m. and 4:30 p.m.  Voicemails left after 4:30 p.m. will not be returned until the following business day.  For prescription refill requests, have your pharmacy contact our office.       Resources For Cancer Patients and their Caregivers ? American Cancer Society: Can assist with transportation, wigs, general needs, runs Look Good Feel Better.        1-888-227-6333 ? Cancer Care: Provides financial assistance, online support groups, medication/co-pay assistance.  1-800-813-HOPE (4673) ? [facility] Assists [place] cancer patients and their families through emotional , educational and financial support.  336-427-4357 ? [place] DSS Where to apply for food stamps, Medicaid and utility assistance. 336-342-1394 ? [facility]: Transportation to medical appointments. 336-347-2287 ? Social Security Administration: May apply for disability if have a Stage IV cancer. 336-342-7796 1-800-772-1213 ? [place] Aging, Disability and Transit Services: Assists with nutrition, care and transit needs. 336-349-2343  Cancer Center Support Programs: [email or web address]@ > Cancer Support Group  2nd Tuesday of the month 1pm-2pm, Journey Room  > Creative Journey  3rd Tuesday of the month 1130am-1pm, Journey Room  > Look Good Feel Better  1st Wednesday of the month 10am-12 noon, Journey Room (Call American Cancer Society to register 1-800-395-5775)    

## 2016-06-25 NOTE — Progress Notes (Signed)
[facility]  PROGRESS NOTE  Patient Care Team: Glenda Chroman, MD as PCP - General (Internal Medicine)  CHIEF COMPLAINTS/PURPOSE OF CONSULTATION:  M-spike CKD stage 3B  CKD bone mineral disease Hypertension Anemia Proteinuria Prostate cancer, s/p IMRT 10/24/2012  2 years ADT  HISTORY OF PRESENTING ILLNESS:  Ryan Rollins 76 y.o. male is here for follow up of an M spike.   He has been doing well. He u nderwent a bone marrow biopsy and tolerated the biopsy well. He also recently had a prostate biopsy to assess for recurrence of his prostate Ca.  He sees a nephrologist regularly. Denies chest pain, SOB, abdominal pain, bone pain, hematuria, dysuria, or any other concerns. Chronic knee pains stable.  MEDICAL HISTORY:  Past Medical History:  Diagnosis Date  . Cerebral aneurysm   . Cerebrovascular disease 11/20/2014  . CKD (chronic kidney disease) stage 3, GFR 30-59 ml/min   . Hypercholesterolemia   . Hypertension   . 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 Prostate cancer ([place])   . Stroke [facility])     SURGICAL HISTORY: Past Surgical History:  Procedure Laterality Date  . ANKLE SURGERY    . CHOLECYSTECTOMY    . prostate surgery  biopsy      SOCIAL HISTORY: Social History   Social History  . Marital status: Married    Spouse name: N/A  . Number of children: N/A  . Years of education: N/A   Occupational History  . Not on file.   Social History Main Topics  . Smoking status: Current Every Day Smoker    Packs/day: 0.25    Years: 60.00    Types: Cigarettes  . Smokeless tobacco: Never Used     Comment: working on [employer]  . Alcohol use 0.0 oz/week     Comment: occ  . Drug use: No  . Sexual activity: No   Other Topics Concern  . Not on file   Social History Narrative  . No narrative on file   Divorced 2 children, daughter and son 2 grandchildren 5 great grandchildren He worked as a Curator for 48 years Smoker,  smokes about 1 pack every 3 days ETOH, hasn't drank since his first stroke in 1987 Substance abuse: Previous Cocaine use Born in [place]: Family History  Problem Relation Age of Onset  . Hypertension Mother   . Diabetes Mother   . Congestive Heart Failure Mother   . Hypertension Father    Mother deceased from congestive heart failure Father deceased at [place] 2 brothers, healthy.  ALLERGIES:  is allergic to morphine and related and penicillins.  MEDICATIONS:  Current Outpatient Prescriptions  Medication Sig Dispense Refill  . albuterol (PROVENTIL HFA;VENTOLIN HFA) 108 (90 BASE) MCG/ACT inhaler Inhale 2 puffs into the lungs every 6 (six) hours as needed for wheezing or shortness of breath.    . bisacodyl (LAXATIVE) 5 MG EC tablet Take 5 mg by mouth every other day.    . clopidogrel (PLAVIX) 75 MG tablet Take 75 mg by mouth daily.    . DULoxetine (CYMBALTA) 60 MG capsule     . enalapril (VASOTEC) 20 MG tablet Take 20 mg by mouth 2 (two) times daily.     . fluticasone (FLONASE) 50 MCG/ACT nasal spray     . furosemide (LASIX) 40 MG tablet Take 20-40 mg by mouth daily as needed for fluid (take 1/2-1 tablet daily as needed.).     Marland Kitchen HYDROcodone-acetaminophen (  NORCO) 7.5-325 MG tablet     . hydrOXYzine (ATARAX/VISTARIL) 25 MG tablet     . levocetirizine (XYZAL) 5 MG tablet Take 5 mg by mouth every evening.    Marland Kitchen NIFEdipine (PROCARDIA-XL/ADALAT CC) 60 MG 24 hr tablet Take 60 mg by mouth 2 (two) times daily.    . pantoprazole (PROTONIX) 40 MG tablet Take 40 mg by mouth 2 (two) times daily.    . Tamsulosin HCl (FLOMAX) 0.4 MG CAPS Take 0.4 mg by mouth 2 (two) times daily.      No current facility-administered medications for this visit.    Review of Systems  Constitutional: Negative.   HENT: Negative.   Eyes: Negative.   Respiratory: Negative.  Negative for shortness of breath.   Cardiovascular: Negative.  Negative for chest pain.    Gastrointestinal: Negative.  Negative for abdominal pain.  Genitourinary: Negative.  Negative for dysuria and hematuria.  Musculoskeletal: Negative.        No bone pain  Skin: Negative.   Neurological: Negative.   Endo/Heme/Allergies: Negative.   Psychiatric/Behavioral: Negative.   All other systems reviewed and are negative. 14 point ROS was done and is otherwise as detailed above or in HPI  PHYSICAL EXAMINATION: ECOG PERFORMANCE STATUS: 1 - Symptomatic but completely ambulatory  Vitals:   06/25/16 1534  BP: 140/80  Pulse: 76  Resp: 20  Temp: 97.6 F (36.4 C)   Filed Weights   06/25/16 1534  Weight: (!) 303 lb 3.2 oz (137.5 kg)   Physical Exam  Constitutional: He is oriented to person, place, and time and well-developed, well-nourished, and in no distress.  HENT:  Head: Normocephalic and atraumatic.  Mouth/Throat: Oropharynx is clear and moist. No oropharyngeal exudate.  Eyes: Conjunctivae and EOM are normal. Pupils are equal, round, and reactive to light. Right eye exhibits no discharge. Left eye exhibits no discharge. No scleral icterus.  Neck: Normal range of motion. Neck supple.  Cardiovascular: Normal rate, regular rhythm and normal heart sounds.   Pulmonary/Chest: Effort normal and breath sounds normal. No respiratory distress. He has no wheezes.  Abdominal: Soft. Bowel sounds are normal. He exhibits no distension. There is no tenderness. There is no rebound and no guarding.  Musculoskeletal: Normal range of motion. He exhibits no edema.  Lymphadenopathy:    He has no cervical adenopathy.  Neurological: He is alert and oriented to person, place, and time.  Skin: Skin is warm and dry.  Psychiatric: Affect and judgment normal.  Nursing note and vitals reviewed.  LABORATORY DATA:  I have reviewed the data as listed Lab Results  Component Value Date   WBC 5.4 06/25/2016   HGB 14.1 06/25/2016   HCT 41.4 06/25/2016   MCV 90.6 06/25/2016   PLT 214 06/25/2016    CMP     Component Value Date/Time   NA 137 06/25/2016 1428   K 4.0 06/25/2016 1428   CL 106 06/25/2016 1428   CO2 25 06/25/2016 1428   GLUCOSE 77 06/25/2016 1428   BUN 36 (H) 06/25/2016 1428   CREATININE 2.99 (H) 06/25/2016 1428   CALCIUM 9.3 06/25/2016 1428   PROT 7.2 06/25/2016 1428   ALBUMIN 4.0 06/25/2016 1428   AST 20 06/25/2016 1428   ALT 18 06/25/2016 1428   ALKPHOS 96 06/25/2016 1428   BILITOT 0.5 06/25/2016 1428   GFRNONAA 19 (L) 06/25/2016 1428   GFRAA 22 (L) 06/25/2016 1428   RADIOGRAPHIC STUDIES: I have personally reviewed the radiological images as listed and agreed with the  findings in the report.  Bone Survey 03/27/2016 IMPRESSION: 1. No lytic or blastic lesion is seen. 2. Mild compression deformity of T6 of uncertain age. 3. 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 Degenerative change diffusely throughout the thoracic and lumbar spine. 4. No active lung disease.  Prostate Biopsy from 06/03/2016 reviewed; benign.   Bone Marrow Biopsy and Aspirate: Diagnosis Bone Marrow, Aspirate,Biopsy, and Clot, left iliac - LIMITED BONE MARROW WITH TRILINEAGE HEMATOPOIESIS. - SEE COMMENT. PERIPHERAL BLOOD: - NORMOCYTIC-NORMOCHROMIC ANEMIA. Diagnosis Note The overall material is limited with lack of aspirate and a core biopsy with extensive aspiration artifacts. There are only a few very small bone marrow particles available for evaluation displaying trilineage hematopoiesis with lack of large aggregates or sheets of plasma cells. Immunohistochemical stains highlight a relatively minor plasma cell component which appears to show a polyclonal staining pattern for kappa and lambda light chains. The findings are not considered specific or diagnostic of plasma cell dyscrasia/neoplasm especially in this limited material. Clinical and cytogenetic correlation is recommended. (BNS:gt, 05/06/16) Susanne Greenhouse MD Pathologist, Electronic Signature (Case signed  05/07/2016)  ASSESSMENT & PLAN:  MGUS CKD stage 3B  CKD bone mineral disease Hypertension Anemia Proteinuria Prostate cancer, s/p IMRT 10/24/2012  2 years ADT  I have reviewed patient's CT guided bone marrow biopsy, it was a poor sample likely due to patient's body habitus however from what was examined by pathology, there is no evidence of clonal plasma neoplasm, he does have polyclonal light chains likely due to CKD.  His M-spike is very small at 0.6 g/dL.  Will continue observation for MGUS at this time.   Reviewed prostate biopsy results with the patient; no evidence of recurrence. He will return for follow up in 3 months with labs:   Orders Placed This Encounter  Procedures  . CBC with Differential    Standing Status:   Future    Standing Expiration Date:   11/25/2016  . Comprehensive metabolic panel    Standing Status:   Future    Standing Expiration Date:   11/25/2016  . Kappa/lambda light chains    Standing Status:   Future    Standing Expiration Date:   11/25/2016  . Beta 2 microglobuline, serum    Standing Status:   Future    Standing Expiration Date:   11/25/2016  . Protein electrophoresis, serum    Standing Status:   Future    Standing Expiration Date:   11/25/2016   All questions were answered. The patient knows to call the clinic with any problems, questions or concerns.  This document serves as a record of services personally performed by Twana First, MD. It was created on her behalf by Martinique Casey, a trained medical scribe. The creation of this record is based on the scribe's personal observations and the provider's statements to them. This document has been checked and approved by the attending provider.  I have reviewed the above documentation for accuracy and completeness and I agree with the above.  This note was electronically signed.    Martinique M Casey  06/25/2016 3:36 PM

## 2016-06-26 LAB — IGG, IGA, IGM
IGA: 59 mg/dL — AB (ref 61–437)
IGM, SERUM: 40 mg/dL (ref 15–143)
IgG (Immunoglobin G), Serum: 1074 mg/dL (ref 700–1600)

## 2016-06-26 LAB — PROTEIN ELECTROPHORESIS, SERUM
A/G RATIO SPE: 1.4 (ref 0.7–1.7)
Albumin ELP: 3.8 g/dL (ref 2.9–4.4)
Alpha-1-Globulin: 0.2 g/dL (ref 0.0–0.4)
Alpha-2-Globulin: 0.7 g/dL (ref 0.4–1.0)
BETA GLOBULIN: 0.8 g/dL (ref 0.7–1.3)
GAMMA GLOBULIN: 1.1 g/dL (ref 0.4–1.8)
Globulin, Total: 2.8 g/dL (ref 2.2–3.9)
M-SPIKE, %: 0.5 g/dL — AB
Total Protein ELP: 6.6 g/dL (ref 6.0–8.5)

## 2016-06-26 LAB — KAPPA/LAMBDA LIGHT CHAINS
KAPPA, LAMDA LIGHT CHAIN RATIO: 0.48 (ref 0.26–1.65)
Kappa free light chain: 28.5 mg/L — ABNORMAL HIGH (ref 3.3–19.4)
Lambda free light chains: 59.5 mg/L — ABNORMAL HIGH (ref 5.7–26.3)

## 2016-06-26 LAB — BETA 2 MICROGLOBULIN, SERUM: Beta-2 Microglobulin: 3.5 mg/L — ABNORMAL HIGH (ref 0.6–2.4)

## 2016-07-04 LAB — IMMUNOFIXATION ELECTROPHORESIS
IGA: 57 mg/dL — AB (ref 61–437)
IgG (Immunoglobin G), Serum: 1013 mg/dL (ref 700–1600)
IgM, Serum: 35 mg/dL (ref 15–143)
Total Protein ELP: 7.1 g/dL (ref 6.0–8.5)

## 2016-09-16 ENCOUNTER — Ambulatory Visit: Payer: Medicare Other | Admitting: Urology

## 2016-09-25 ENCOUNTER — Ambulatory Visit (HOSPITAL_COMMUNITY): Payer: Medicare Other

## 2016-09-25 ENCOUNTER — Other Ambulatory Visit (HOSPITAL_COMMUNITY): Payer: Medicare Other

## 2016-10-09 ENCOUNTER — Ambulatory Visit (HOSPITAL_COMMUNITY): Payer: Medicare Other

## 2016-10-09 ENCOUNTER — Other Ambulatory Visit (HOSPITAL_COMMUNITY): Payer: Medicare Other

## 2016-10-21 ENCOUNTER — Ambulatory Visit: Payer: Medicare Other | Admitting: Urology

## 2016-11-25 ENCOUNTER — Ambulatory Visit (INDEPENDENT_AMBULATORY_CARE_PROVIDER_SITE_OTHER): Payer: Medicare Other | Admitting: Urology

## 2016-11-25 DIAGNOSIS — N5201 Erectile dysfunction due to arterial insufficiency: Secondary | ICD-10-CM

## 2016-11-25 DIAGNOSIS — N401 Enlarged prostate with lower urinary tract symptoms: Secondary | ICD-10-CM

## 2016-11-25 DIAGNOSIS — C61 Malignant neoplasm of prostate: Secondary | ICD-10-CM | POA: Diagnosis not present

## 2017-01-31 ENCOUNTER — Encounter (HOSPITAL_COMMUNITY): Payer: Self-pay | Admitting: Emergency Medicine

## 2017-01-31 ENCOUNTER — Emergency Department (HOSPITAL_COMMUNITY)
Admission: EM | Admit: 2017-01-31 | Discharge: 2017-01-31 | Disposition: A | Discharge status: Home Health | Payer: Medicare Other | Attending: Emergency Medicine | Admitting: Emergency Medicine

## 2017-01-31 ENCOUNTER — Emergency Department (HOSPITAL_COMMUNITY): Payer: Medicare Other

## 2017-01-31 DIAGNOSIS — N184 Chronic kidney disease, stage 4 (severe): Secondary | ICD-10-CM | POA: Diagnosis not present

## 2017-01-31 DIAGNOSIS — Z8546 Personal history of malignant neoplasm of prostate: Secondary | ICD-10-CM | POA: Insufficient documentation

## 2017-01-31 DIAGNOSIS — J40 Bronchitis, not specified as acute or chronic: Secondary | ICD-10-CM

## 2017-01-31 DIAGNOSIS — F17210 Nicotine dependence, cigarettes, uncomplicated: Secondary | ICD-10-CM | POA: Diagnosis not present

## 2017-01-31 DIAGNOSIS — E7800 Pure hypercholesterolemia, unspecified: Secondary | ICD-10-CM | POA: Diagnosis not present

## 2017-01-31 DIAGNOSIS — Z8673 Personal history of transient ischemic attack (TIA), and cerebral infarction without residual deficits: Secondary | ICD-10-CM | POA: Insufficient documentation

## 2017-01-31 DIAGNOSIS — R05 Cough: Secondary | ICD-10-CM | POA: Diagnosis present

## 2017-01-31 DIAGNOSIS — Z79899 Other long term (current) drug therapy: Secondary | ICD-10-CM | POA: Insufficient documentation

## 2017-01-31 DIAGNOSIS — J209 Acute bronchitis, unspecified: Secondary | ICD-10-CM | POA: Insufficient documentation

## 2017-01-31 DIAGNOSIS — I129 Hypertensive chronic kidney disease with stage 1 through stage 4 chronic kidney disease, or unspecified chronic kidney disease: Secondary | ICD-10-CM | POA: Diagnosis not present

## 2017-01-31 MED ORDER — ALBUTEROL SULFATE HFA 108 (90 BASE) MCG/ACT IN AERS
2.0000 | INHALATION_SPRAY | Freq: Once | RESPIRATORY_TRACT | Status: AC
  Administered 2017-01-31: 2 via RESPIRATORY_TRACT
  Filled 2017-01-31: qty 6.7

## 2017-01-31 MED ORDER — IPRATROPIUM-ALBUTEROL 0.5-2.5 (3) MG/3ML IN SOLN
3.0000 mL | Freq: Once | RESPIRATORY_TRACT | Status: AC
  Administered 2017-01-31: 3 mL via RESPIRATORY_TRACT
  Filled 2017-01-31: qty 3

## 2017-01-31 MED ORDER — PREDNISONE 20 MG PO TABS: 40.0000 mg | ORAL_TABLET | Freq: Every day | ORAL | 0 refills | Status: AC

## 2017-01-31 MED ORDER — DOXYCYCLINE HYCLATE 100 MG PO CAPS: 100.0000 mg | ORAL_CAPSULE | Freq: Two times a day (BID) | ORAL | 0 refills | Status: DC

## 2017-01-31 NOTE — ED Triage Notes (Signed)
Pt reports cough and congestion for about a week with pressure in face.

## 2017-01-31 NOTE — Discharge Instructions (Signed)
1-2 puffs of the inhaler every 4-6 hrs as needed.  Follow-up with your doctor for recheck.   °

## 2017-01-31 NOTE — ED Provider Notes (Signed)
[facility] EMERGENCY DEPARTMENT Provider Note   CSN: [id number] Arrival date & time: 01/31/17  1547     History   Chief Complaint Chief Complaint  Patient presents with  . Cough    HPI Ryan Rollins is a 76 y.o. male.  HPI   Ryan Rollins is a 76 y.o. male who presents to the Emergency Department complaining of cough, nasal congestion, sinus pain and pressure. Symptoms present for one week.  Cough is slightly productive.  He describes "feeling mucus in my chest, but unable to cough it out."  He has not taken any OTC cold or cough medications.  He also complains of facial pressure, sore throat and difficulty breathing out of his nose.  He denies fever, chest pain, shortness of breath, abdominal pain, nausea and vomiting.       Past Medical History:  Diagnosis Date  . Cerebral aneurysm   . Cerebrovascular disease 11/20/2014  . CKD (chronic kidney disease) stage 3, GFR 30-59 ml/min (HCC)   . Hypercholesterolemia   . Hypertension   . 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 Prostate cancer ([place])   . Stroke [facility])     Patient Active Problem List   Diagnosis Date Noted  . 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 Chest pain 11/20/2014  . Hypertension 11/20/2014  . Cerebrovascular disease 11/20/2014  . Cough 10/24/2014  . Hyperglycemia 10/24/2014  . Hypokalemia 10/24/2014  . Chronic kidney disease (CKD), stage IV (severe) ([place])   . Elevated troponin 10/23/2014    Past Surgical History:  Procedure Laterality Date  . ANKLE SURGERY    . CHOLECYSTECTOMY    . prostate surgery  biopsy         Home Medications    Prior to Admission medications   Medication Sig Start Date End Date Taking? Authorizing Provider  albuterol (PROVENTIL HFA;VENTOLIN HFA) 108 (90 BASE) MCG/ACT inhaler Inhale 2 puffs into the lungs every 6 (six) hours as needed for wheezing or shortness of breath.    [provider]    bisacodyl (LAXATIVE) 5 MG EC tablet Take 5 mg by mouth every other day.    [provider]  clopidogrel (PLAVIX) 75 MG tablet Take 75 mg by mouth daily.    [provider]  doxycycline (VIBRAMYCIN) 100 MG capsule Take 1 capsule (100 mg total) by mouth 2 (two) times daily. 01/31/17   Lunna Vogelgesang, PA-C  enalapril (VASOTEC) 20 MG tablet Take 20 mg by mouth 2 (two) times daily.     [provider]  fluticasone Asencion Islam) 50 MCG/ACT nasal spray  03/26/16   [provider]  furosemide (LASIX) 40 MG tablet Take 20-40 mg by mouth daily as needed for fluid (take 1/2-1 tablet daily as needed.).     [provider]  HYDROcodone-acetaminophen [facility]) 7.5-325 MG tablet  03/26/16   [provider]  hydrOXYzine (ATARAX/VISTARIL) 25 MG tablet  03/26/16   [provider]  levocetirizine (XYZAL) 5 MG tablet Take 5 mg by mouth every evening.    [provider]  NIFEdipine (PROCARDIA-XL/ADALAT CC) 60 MG 24 hr tablet Take 60 mg by mouth 2 (two) times daily.    [provider]  pantoprazole (PROTONIX) 40 MG tablet Take 40 mg by mouth 2 (two) times daily.    [provider]  predniSONE (DELTASONE) 20 MG tablet Take 2 tablets (40 mg total) by mouth daily. 01/31/17   Dixon Luczak, PA-C  Tamsulosin HCl (FLOMAX) 0.4  MG CAPS Take 0.4 mg by mouth 2 (two) times daily.     [provider]    Family History Family History  Problem Relation Age of Onset  . Hypertension Mother   . Diabetes Mother   . Congestive Heart Failure Mother   . Hypertension Father     Social History Social History  Substance Use Topics  . Smoking status: Current Every Day Smoker    Packs/day: 1.00    Years: 60.00    Types: Cigarettes  . Smokeless tobacco: Never Used  . Alcohol use 0.0 oz/week     Comment: occ     Allergies   Morphine and related and Penicillins   Review of Systems Review of Systems  Constitutional: Negative for  appetite change, chills and fever.  HENT: Positive for congestion, rhinorrhea, sinus pain, sinus pressure and sore throat. Negative for ear pain, facial swelling and trouble swallowing.   Respiratory: Positive for cough. Negative for chest tightness, shortness of breath and wheezing.   Cardiovascular: Negative for chest pain.  Gastrointestinal: Negative for abdominal pain, nausea and vomiting.  Genitourinary: Negative for dysuria.  Musculoskeletal: Negative for arthralgias.  Skin: Negative for rash.  Neurological: Negative for dizziness, weakness and numbness.  Hematological: Negative for adenopathy.  All other systems reviewed and are negative.    Physical Exam Updated Vital Signs BP 109/61 (BP Location: Left Arm)   Pulse 83   Temp (!) 97.5 F (36.4 C) (Oral)   Resp 18   SpO2 98%   Physical Exam  Constitutional: He is oriented to person, place, and time. He appears well-developed and well-nourished. No distress.  HENT:  Head: Normocephalic and atraumatic.  Right Ear: Tympanic membrane and ear canal normal.  Left Ear: Tympanic membrane and ear canal normal.  Nose: Mucosal edema and sinus tenderness present. Right sinus exhibits frontal sinus tenderness. Left sinus exhibits frontal sinus tenderness.  Mouth/Throat: Uvula is midline, oropharynx is clear and moist and mucous membranes are normal. No oropharyngeal exudate.  Eyes: Pupils are equal, round, and reactive to light. EOM are normal.  Neck: Normal range of motion, full passive range of motion without pain and phonation normal. Neck supple.  Cardiovascular: Normal rate, regular rhythm, normal heart sounds and intact distal pulses.   No murmur heard. Pulmonary/Chest: Effort normal. No stridor. No respiratory distress. He has no wheezes. He has no rales. He exhibits no tenderness.  Coarse lungs sounds bilaterally.  No rales   Abdominal: Soft. He exhibits no distension. There is no tenderness. There is no guarding.    Musculoskeletal: Normal range of motion. He exhibits no edema.  Lymphadenopathy:    He has no cervical adenopathy.  Neurological: He is alert and oriented to person, place, and time. He exhibits normal muscle tone. Coordination normal.  Skin: Skin is warm and dry. Capillary refill takes less than 2 seconds.  Nursing note and vitals reviewed.    ED Treatments / Results  Labs (all labs ordered are listed, but only abnormal results are displayed) Labs Reviewed - No data to display  EKG  EKG Interpretation None       Radiology Dg Chest 2 View  Result Date: 01/31/2017 CLINICAL DATA:  Cough and short of breath EXAM: CHEST  2 VIEW COMPARISON:  11/20/2014 FINDINGS: The heart size and mediastinal contours are within normal limits. Both lungs are clear. The visualized skeletal structures are unremarkable. IMPRESSION: No active cardiopulmonary disease. Electronically Signed   By: Franchot Gallo M.D.   On: 01/31/2017  17:03    Procedures Procedures (including critical care time)  Medications Ordered in ED Medications  ipratropium-albuterol (DUONEB) 0.5-2.5 (3) MG/3ML nebulizer solution 3 mL (3 mLs Nebulization Given 01/31/17 1658)  albuterol (PROVENTIL HFA;VENTOLIN HFA) 108 (90 Base) MCG/ACT inhaler 2 puff (2 puffs Inhalation Given 01/31/17 1658)     Initial Impression / Assessment and Plan / ED Course  I have reviewed the triage vital signs and the nursing notes.  Pertinent labs & imaging results that were available during my care of the patient were reviewed by me and considered in my medical decision making (see chart for details).     Pt is non-toxic appearing. No respiratory distress.  Vitals reviewed.    On recheck, lung sounds improved after albuterol neb.  MDI dispensed for home use.    Patient also seen by Dr. Dayna Barker and care plan discussed.    Pt stable for d/c, agrees to PCP f/u.  Return precautions discussed.   Final Clinical Impressions(s) / ED Diagnoses    Final diagnoses:  Bronchitis    New Prescriptions New Prescriptions   DOXYCYCLINE (VIBRAMYCIN) 100 MG CAPSULE    Take 1 capsule (100 mg total) by mouth 2 (two) times daily.   PREDNISONE (DELTASONE) 20 MG TABLET    Take 2 tablets (40 mg total) by mouth daily.     Kem Parkinson, PA-C 02/01/17 8101    Merrily Pew, MD 02/02/17 [phone]

## 2017-05-26 ENCOUNTER — Ambulatory Visit: Payer: Medicare Other | Admitting: Urology

## 2017-10-13 ENCOUNTER — Ambulatory Visit: Payer: Medicare Other | Admitting: Urology

## 2017-11-17 ENCOUNTER — Ambulatory Visit (INDEPENDENT_AMBULATORY_CARE_PROVIDER_SITE_OTHER): Payer: Medicare Other | Admitting: Urology

## 2017-11-17 DIAGNOSIS — R351 Nocturia: Secondary | ICD-10-CM | POA: Diagnosis not present

## 2017-11-17 DIAGNOSIS — C61 Malignant neoplasm of prostate: Secondary | ICD-10-CM | POA: Diagnosis not present

## 2017-11-17 DIAGNOSIS — N401 Enlarged prostate with lower urinary tract symptoms: Secondary | ICD-10-CM | POA: Diagnosis not present

## 2017-12-10 ENCOUNTER — Other Ambulatory Visit: Payer: Self-pay

## 2017-12-10 ENCOUNTER — Emergency Department (HOSPITAL_COMMUNITY): Payer: Medicare Other

## 2017-12-10 ENCOUNTER — Emergency Department (HOSPITAL_COMMUNITY)
Admission: EM | Admit: 2017-12-10 | Discharge: 2017-12-10 | Disposition: A | Discharge status: Home / Self Care | Payer: Medicare Other | Attending: Emergency Medicine | Admitting: Emergency Medicine

## 2017-12-10 ENCOUNTER — Encounter (HOSPITAL_COMMUNITY): Payer: Self-pay | Admitting: Emergency Medicine

## 2017-12-10 DIAGNOSIS — Z8673 Personal history of transient ischemic attack (TIA), and cerebral infarction without residual deficits: Secondary | ICD-10-CM | POA: Insufficient documentation

## 2017-12-10 DIAGNOSIS — Y69 Unspecified misadventure during surgical and medical care: Secondary | ICD-10-CM | POA: Diagnosis not present

## 2017-12-10 DIAGNOSIS — R42 Dizziness and giddiness: Secondary | ICD-10-CM

## 2017-12-10 DIAGNOSIS — T50905A Adverse effect of unspecified drugs, medicaments and biological substances, initial encounter: Secondary | ICD-10-CM | POA: Diagnosis not present

## 2017-12-10 DIAGNOSIS — T887XXA Unspecified adverse effect of drug or medicament, initial encounter: Secondary | ICD-10-CM | POA: Diagnosis not present

## 2017-12-10 DIAGNOSIS — I129 Hypertensive chronic kidney disease with stage 1 through stage 4 chronic kidney disease, or unspecified chronic kidney disease: Secondary | ICD-10-CM | POA: Insufficient documentation

## 2017-12-10 DIAGNOSIS — Z79899 Other long term (current) drug therapy: Secondary | ICD-10-CM | POA: Diagnosis not present

## 2017-12-10 DIAGNOSIS — R55 Syncope and collapse: Secondary | ICD-10-CM | POA: Diagnosis present

## 2017-12-10 DIAGNOSIS — F17210 Nicotine dependence, cigarettes, uncomplicated: Secondary | ICD-10-CM | POA: Diagnosis not present

## 2017-12-10 DIAGNOSIS — Z7901 Long term (current) use of anticoagulants: Secondary | ICD-10-CM | POA: Diagnosis not present

## 2017-12-10 DIAGNOSIS — N184 Chronic kidney disease, stage 4 (severe): Secondary | ICD-10-CM | POA: Insufficient documentation

## 2017-12-10 LAB — BASIC METABOLIC PANEL
Anion gap: 10 (ref 5–15)
BUN: 45 mg/dL — AB (ref 8–23)
CALCIUM: 9.4 mg/dL (ref 8.9–10.3)
CO2: 25 mmol/L (ref 22–32)
Chloride: 103 mmol/L (ref 98–111)
Creatinine, Ser: 3 mg/dL — ABNORMAL HIGH (ref 0.61–1.24)
GFR calc Af Amer: 22 mL/min — ABNORMAL LOW (ref >60)
GFR calc non Af Amer: 19 mL/min — ABNORMAL LOW (ref >60)
Glucose, Bld: 114 mg/dL — ABNORMAL HIGH (ref 70–99)
Potassium: 3.5 mmol/L (ref 3.5–5.1)
Sodium: 138 mmol/L (ref 135–145)

## 2017-12-10 LAB — CBC
HCT: 36.7 % — ABNORMAL LOW (ref 39.0–52.0)
Hemoglobin: 12.2 g/dL — ABNORMAL LOW (ref 13.0–17.0)
MCH: 30.4 pg (ref 26.0–34.0)
MCHC: 33.2 g/dL (ref 30.0–36.0)
MCV: 91.5 fL (ref 78.0–100.0)
PLATELETS: 184 10*3/uL (ref 150–400)
RBC: 4.01 MIL/uL — AB (ref 4.22–5.81)
RDW: 13.6 % (ref 11.5–15.5)
WBC: 6.3 10*3/uL (ref 4.0–10.5)

## 2017-12-10 LAB — I-STAT TROPONIN, ED: Troponin i, poc: 0.06 ng/mL (ref 0.00–0.08)

## 2017-12-10 LAB — URINALYSIS, ROUTINE W REFLEX MICROSCOPIC
BILIRUBIN URINE: NEGATIVE
Glucose, UA: NEGATIVE mg/dL
HGB URINE DIPSTICK: NEGATIVE
Ketones, ur: NEGATIVE mg/dL
Leukocytes, UA: NEGATIVE
NITRITE: NEGATIVE
PROTEIN: NEGATIVE mg/dL
Specific Gravity, Urine: 1.006 (ref 1.005–1.030)
pH: 6 (ref 5.0–8.0)

## 2017-12-10 LAB — CBG MONITORING, ED: GLUCOSE-CAPILLARY: 120 mg/dL — AB (ref 70–99)

## 2017-12-10 MED ORDER — SODIUM CHLORIDE 0.9 % IV BOLUS
1000.0000 mL | Freq: Once | INTRAVENOUS | Status: AC
  Administered 2017-12-10: 1000 mL via INTRAVENOUS

## 2017-12-10 NOTE — ED Provider Notes (Signed)
[facility] EMERGENCY DEPARTMENT Provider Note   CSN: [id number] Arrival date & time: 12/10/17  9702     History   Chief Complaint Chief Complaint  Patient presents with  . Near Syncope    HPI Ryan Rollins is a 77 y.o. male.  Patient is a 77 year old male with past medical history of hypertension, prostate cancer, and prior cerebral aneurysm.  He presents today for evaluation of dizziness.  He went to bed last night feeling well, then woke this morning with an itchy sensation to his skin.  He took 1 of his Vistaril tablets, then became very dizzy.  He states that when he tried to stand he felt the room spinning.  He then called his wife to call 911.  He states that this is similar to how he felt with aneurysm issues in the past.  He denies any headache or visual disturbances.  The history is provided by the patient.  Near Syncope  This is a new problem. The current episode started less than 1 hour ago. The problem occurs constantly. The problem has not changed since onset.Pertinent negatives include no chest pain, no abdominal pain, no headaches and no shortness of breath. Nothing aggravates the symptoms. Nothing relieves the symptoms. He has tried nothing for the symptoms.    Past Medical History:  Diagnosis Date  . Cerebral aneurysm   . Cerebrovascular disease 11/20/2014  . CKD (chronic kidney disease) stage 3, GFR 30-59 ml/min (HCC)   . Hypercholesterolemia   . Hypertension   . 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 Prostate cancer ([place])   . Stroke [facility])     Patient Active Problem List   Diagnosis Date Noted  . 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 Chest pain 11/20/2014  . Hypertension 11/20/2014  . Cerebrovascular disease 11/20/2014  . Cough 10/24/2014  . Hyperglycemia 10/24/2014  . Hypokalemia 10/24/2014  . Chronic kidney disease (CKD), stage IV (severe) ([place])   . Elevated troponin 10/23/2014    Past  Surgical History:  Procedure Laterality Date  . ANKLE SURGERY    . CHOLECYSTECTOMY    . prostate surgery  biopsy          Home Medications    Prior to Admission medications   Medication Sig Start Date End Date Taking? Authorizing Provider  albuterol (PROVENTIL HFA;VENTOLIN HFA) 108 (90 BASE) MCG/ACT inhaler Inhale 2 puffs into the lungs every 6 (six) hours as needed for wheezing or shortness of breath.    [provider]  bisacodyl (LAXATIVE) 5 MG EC tablet Take 5 mg by mouth every other day.    [provider]  clopidogrel (PLAVIX) 75 MG tablet Take 75 mg by mouth daily.    [provider]  doxycycline (VIBRAMYCIN) 100 MG capsule Take 1 capsule (100 mg total) by mouth 2 (two) times daily. 01/31/17   Triplett, Tammy, PA-C  enalapril (VASOTEC) 20 MG tablet Take 20 mg by mouth 2 (two) times daily.     [provider]  fluticasone Asencion Islam) 50 MCG/ACT nasal spray  03/26/16   [provider]  furosemide (LASIX) 40 MG tablet Take 20-40 mg by mouth daily as needed for fluid (take 1/2-1 tablet daily as needed.).     [provider]  HYDROcodone-acetaminophen [facility]) 7.5-325 MG tablet  03/26/16   [provider]  hydrOXYzine (ATARAX/VISTARIL) 25 MG tablet  03/26/16   [provider]  levocetirizine (XYZAL) 5 MG tablet Take 5 mg by mouth  every evening.    [provider]  NIFEdipine (PROCARDIA-XL/ADALAT CC) 60 MG 24 hr tablet Take 60 mg by mouth 2 (two) times daily.    [provider]  pantoprazole (PROTONIX) 40 MG tablet Take 40 mg by mouth 2 (two) times daily.    [provider]  predniSONE (DELTASONE) 20 MG tablet Take 2 tablets (40 mg total) by mouth daily. 01/31/17   Triplett, Tammy, PA-C  Tamsulosin HCl (FLOMAX) 0.4 MG CAPS Take 0.4 mg by mouth 2 (two) times daily.     [provider]    Family History Family History  Problem Relation Age of Onset  . Hypertension Mother   .  Diabetes Mother   . Congestive Heart Failure Mother   . Hypertension Father     Social History Social History   Tobacco Use  . Smoking status: Current Every Day Smoker    Packs/day: 1.00    Years: 60.00    Pack years: 60.00    Types: Cigarettes  . Smokeless tobacco: Never Used  Substance Use Topics  . Alcohol use: Yes    Alcohol/week: 0.0 standard drinks    Comment: occ  . Drug use: No     Allergies   Morphine and related and Penicillins   Review of Systems Review of Systems  Respiratory: Negative for shortness of breath.   Cardiovascular: Positive for near-syncope. Negative for chest pain.  Gastrointestinal: Negative for abdominal pain.  Neurological: Negative for headaches.  All other systems reviewed and are negative.    Physical Exam Updated Vital Signs BP 126/76   Pulse 78   Temp 98.3 F (36.8 C) (Oral)   Resp 17   SpO2 100%   Physical Exam  Constitutional: He is oriented to person, place, and time. He appears well-developed and well-nourished. No distress.  HENT:  Head: Normocephalic and atraumatic.  Mouth/Throat: Oropharynx is clear and moist.  Eyes: Pupils are equal, round, and reactive to light. EOM are normal.  Neck: Normal range of motion. Neck supple.  Cardiovascular: Normal rate and regular rhythm. Exam reveals no friction rub.  No murmur heard. Pulmonary/Chest: Effort normal and breath sounds normal. No respiratory distress. He has no wheezes. He has no rales.  Abdominal: Soft. Bowel sounds are normal. He exhibits no distension. There is no tenderness.  Musculoskeletal: Normal range of motion. He exhibits no edema.  Neurological: He is alert and oriented to person, place, and time. No cranial nerve deficit. He exhibits normal muscle tone. Coordination normal.  Skin: Skin is warm and dry. He is not diaphoretic.  Nursing note and vitals reviewed.    ED Treatments / Results  Labs (all labs ordered are listed, but only abnormal results are  displayed) Labs Reviewed  BASIC METABOLIC PANEL  CBC  URINALYSIS, ROUTINE W REFLEX MICROSCOPIC  CBG MONITORING, ED  I-STAT TROPONIN, ED    EKG EKG Interpretation  Date/Time:  Thursday December 10 2017 03:59:08 EDT Ventricular Rate:  72 PR Interval:    QRS Duration: 87 QT Interval:  386 QTC Calculation: 423 R Axis:   26 Text Interpretation:  Sinus rhythm Abnormal R-wave progression, early transition Confirmed by Veryl Speak [phone]) on 12/10/2017 4:09:58 AM   Radiology No results found.  Procedures Procedures (including critical care time)  Medications Ordered in ED Medications  sodium chloride 0.9 % bolus 1,000 mL (has no administration in time range)     Initial Impression / Assessment and Plan / ED Course  I have reviewed the triage vital  signs and the nursing notes.  Pertinent labs & imaging results that were available during my care of the patient were reviewed by me and considered in my medical decision making (see chart for details).  Patient presents here with dizziness that began this morning after taking hydroxyzine for itching.  I am uncertain as to the cause of his dizziness, however his work-up is reassuring.  His CBC is essentially unremarkable and basic metabolic panel does show a creatinine of 3 which is consistent with his baseline.  Head CT is negative and he is neurologically intact.  He is feeling better after IV fluids.  He was ambulated in the department without any difficulty and is feeling much better.  At this point, nothing appears emergent and I believe he is appropriate for discharge.  He is to return as needed for any problems.  Final Clinical Impressions(s) / ED Diagnoses   Final diagnoses:  None    ED Discharge Orders    None       Veryl Speak, MD 12/10/17 [phone]

## 2017-12-10 NOTE — ED Notes (Signed)
Patient ambulated hallway without any dizziness.

## 2017-12-10 NOTE — Discharge Instructions (Signed)
Return to the emergency department if your symptoms significantly worsen or change.  Continue other medications as previously prescribed.

## 2017-12-10 NOTE — ED Triage Notes (Signed)
Pt states he was woken up from sleep and was itching. Pt states he took hydroxyzine and shortly after he became dizzy. Pt denies N/V.

## 2017-12-14 ENCOUNTER — Emergency Department (HOSPITAL_COMMUNITY)
Admission: EM | Admit: 2017-12-14 | Discharge: 2017-12-15 | Disposition: A | Discharge status: Home / Self Care | Payer: Medicare Other | Attending: Emergency Medicine | Admitting: Emergency Medicine

## 2017-12-14 ENCOUNTER — Other Ambulatory Visit: Payer: Self-pay

## 2017-12-14 ENCOUNTER — Encounter (HOSPITAL_COMMUNITY): Payer: Self-pay | Admitting: Emergency Medicine

## 2017-12-14 DIAGNOSIS — J0101 Acute recurrent maxillary sinusitis: Secondary | ICD-10-CM | POA: Diagnosis not present

## 2017-12-14 DIAGNOSIS — I129 Hypertensive chronic kidney disease with stage 1 through stage 4 chronic kidney disease, or unspecified chronic kidney disease: Secondary | ICD-10-CM | POA: Insufficient documentation

## 2017-12-14 DIAGNOSIS — Z8673 Personal history of transient ischemic attack (TIA), and cerebral infarction without residual deficits: Secondary | ICD-10-CM | POA: Diagnosis not present

## 2017-12-14 DIAGNOSIS — F17210 Nicotine dependence, cigarettes, uncomplicated: Secondary | ICD-10-CM | POA: Diagnosis not present

## 2017-12-14 DIAGNOSIS — Z79899 Other long term (current) drug therapy: Secondary | ICD-10-CM | POA: Insufficient documentation

## 2017-12-14 DIAGNOSIS — Z8546 Personal history of malignant neoplasm of prostate: Secondary | ICD-10-CM | POA: Diagnosis not present

## 2017-12-14 DIAGNOSIS — R51 Headache: Secondary | ICD-10-CM | POA: Diagnosis present

## 2017-12-14 DIAGNOSIS — N184 Chronic kidney disease, stage 4 (severe): Secondary | ICD-10-CM | POA: Diagnosis not present

## 2017-12-14 DIAGNOSIS — Z7901 Long term (current) use of anticoagulants: Secondary | ICD-10-CM | POA: Insufficient documentation

## 2017-12-14 DIAGNOSIS — J0100 Acute maxillary sinusitis, unspecified: Secondary | ICD-10-CM

## 2017-12-14 NOTE — ED Triage Notes (Signed)
Patient complaining of facial pain around eyes and nasal congestion x 2-3 days.

## 2017-12-15 ENCOUNTER — Emergency Department (HOSPITAL_COMMUNITY): Payer: Medicare Other

## 2017-12-15 DIAGNOSIS — J0101 Acute recurrent maxillary sinusitis: Secondary | ICD-10-CM | POA: Diagnosis not present

## 2017-12-15 MED ORDER — DOXYCYCLINE HYCLATE 100 MG PO CAPS: 100.0000 mg | ORAL_CAPSULE | Freq: Two times a day (BID) | ORAL | 0 refills | Status: AC

## 2017-12-15 MED ORDER — FLUTICASONE PROPIONATE 50 MCG/ACT NA SUSP: 1.0000 | Freq: Every day | NASAL | 2 refills | Status: AC

## 2017-12-15 NOTE — Discharge Instructions (Addendum)
You may continue to take the mucinex in addition to the medications we give you. Follow up with your doctor. Return here as needed.

## 2017-12-15 NOTE — ED Provider Notes (Signed)
[facility] EMERGENCY DEPARTMENT Provider Note   CSN: [id number] Arrival date & time: 12/14/17  2124     History   Chief Complaint Chief Complaint  Patient presents with  . Facial Pain    HPI PAULA ZIETZ is a 77 y.o. male with hx of CKD, HTN, prostate cancer, CVA, cerebral aneurysm who presents to the ED with facial pain. Patient reports that he has had sinus congestion and taking Mucinex that helped for a few days but then stopped. He also used nasal spray that only helps for a short time. The facial pain has gotten worse and now patient feels like it is making his teeth hurt. Patient denies fever or chills, chest pain or shortness of breath.   HPI  Past Medical History:  Diagnosis Date  . Cerebral aneurysm   . Cerebrovascular disease 11/20/2014  . CKD (chronic kidney disease) stage 3, GFR 30-59 ml/min (HCC)   . Hypercholesterolemia   . Hypertension   . 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 Prostate cancer ([place])   . Stroke [facility])     Patient Active Problem List   Diagnosis Date Noted  . 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 Chest pain 11/20/2014  . Hypertension 11/20/2014  . Cerebrovascular disease 11/20/2014  . Cough 10/24/2014  . Hyperglycemia 10/24/2014  . Hypokalemia 10/24/2014  . Chronic kidney disease (CKD), stage IV (severe) ([place])   . Elevated troponin 10/23/2014    Past Surgical History:  Procedure Laterality Date  . ANKLE SURGERY    . CHOLECYSTECTOMY    . prostate surgery  biopsy          Home Medications    Prior to Admission medications   Medication Sig Start Date End Date Taking? Authorizing Provider  albuterol (PROVENTIL HFA;VENTOLIN HFA) 108 (90 BASE) MCG/ACT inhaler Inhale 2 puffs into the lungs every 6 (six) hours as needed for wheezing or shortness of breath.    [provider]  atorvastatin (LIPITOR) 10 MG tablet Take 10 mg by mouth daily.    [provider]    bisacodyl (LAXATIVE) 5 MG EC tablet Take 5 mg by mouth every other day.    [provider]  calcitRIOL (ROCALTROL) 0.25 MCG capsule Take 0.25 mcg by mouth every other day. Mon, Wed, Fri    [provider]  clopidogrel (PLAVIX) 75 MG tablet Take 75 mg by mouth daily.    [provider]  doxycycline (VIBRAMYCIN) 100 MG capsule Take 1 capsule (100 mg total) by mouth 2 (two) times daily. 12/15/17   Ashley Murrain, NP  enalapril (VASOTEC) 20 MG tablet Take 20 mg by mouth 2 (two) times daily.     [provider]  fluticasone (FLONASE) 50 MCG/ACT nasal spray Place 1 spray into both nostrils daily. 12/15/17   Ashley Murrain, NP  furosemide (LASIX) 40 MG tablet Take 20-40 mg by mouth daily as needed for fluid (take 1/2-1 tablet daily as needed.).     [provider]  HYDROcodone-acetaminophen [facility]) 7.5-325 MG tablet  03/26/16   [provider]  hydrOXYzine (ATARAX/VISTARIL) 25 MG tablet  03/26/16   [provider]  levocetirizine (XYZAL) 5 MG tablet Take 5 mg by mouth every evening.    [provider]  NIFEdipine (PROCARDIA-XL/ADALAT CC) 60 MG 24 hr tablet Take 60 mg by mouth 2 (two) times daily.    [provider]  pantoprazole (PROTONIX) 40 MG tablet Take 40 mg by mouth 2 (  two) times daily.    [provider]  predniSONE (DELTASONE) 20 MG tablet Take 2 tablets (40 mg total) by mouth daily. 01/31/17   Triplett, Tammy, PA-C  Tamsulosin HCl (FLOMAX) 0.4 MG CAPS Take 0.4 mg by mouth 2 (two) times daily.     [provider]    Family History Family History  Problem Relation Age of Onset  . Hypertension Mother   . Diabetes Mother   . Congestive Heart Failure Mother   . Hypertension Father     Social History Social History   Tobacco Use  . Smoking status: Current Every Day Smoker    Packs/day: 1.00    Years: 60.00    Pack years: 60.00    Types: Cigarettes  . Smokeless tobacco: Never Used  Substance  Use Topics  . Alcohol use: Yes    Alcohol/week: 0.0 standard drinks    Comment: occ  . Drug use: No     Allergies   Morphine and related and Penicillins   Review of Systems Review of Systems  Constitutional: Negative for chills, diaphoresis and fever.  HENT: Positive for congestion, rhinorrhea, sinus pressure and sinus pain. Negative for sore throat and trouble swallowing.   Eyes: Negative for visual disturbance.  Respiratory: Negative for shortness of breath.   Cardiovascular: Negative for chest pain.  Gastrointestinal: Negative for abdominal pain, nausea and vomiting.  Musculoskeletal: Back pain: chronic.  Skin: Negative for rash.  Neurological: Positive for headaches. Negative for syncope.  Psychiatric/Behavioral: Negative for confusion.     Physical Exam Updated Vital Signs BP (!) 134/93 (BP Location: Right Arm)   Pulse 75   Temp 97.6 F (36.4 C) (Oral)   Resp 16   Ht 6\' 3"  (1.905 m)   Wt 131.1 kg   SpO2 99%   BMI 36.12 kg/m   Physical Exam  Constitutional: He is oriented to person, place, and time. He appears well-developed and well-nourished. No distress.  HENT:  Right Ear: Tympanic membrane normal.  Left Ear: Tympanic membrane normal.  Nose: Rhinorrhea present. Right sinus exhibits maxillary sinus tenderness. Left sinus exhibits maxillary sinus tenderness.  Mouth/Throat: Uvula is midline, oropharynx is clear and moist and mucous membranes are normal.  Eyes: EOM are normal.  Neck: Neck supple.  Cardiovascular: Normal rate and regular rhythm.  Pulmonary/Chest: Effort normal. He has no wheezes. He has no rales.  Abdominal: Soft. Bowel sounds are normal. There is no tenderness.  Musculoskeletal: Normal range of motion.  Neurological: He is alert and oriented to person, place, and time. No cranial nerve deficit.  Skin: Skin is warm and dry.  Psychiatric: He has a normal mood and affect.  Nursing note and vitals reviewed.    ED Treatments / Results   Labs (all labs ordered are listed, but only abnormal results are displayed) Labs Reviewed - No data to display  Radiology Dg Chest 2 View  Result Date: 12/15/2017 CLINICAL DATA:  Shortness of breath and chest discomfort for 2 days. History of hypertension and hypercholesterolemia. EXAM: CHEST - 2 VIEW COMPARISON:  Chest radiograph January 31, 2017 FINDINGS: Cardiomediastinal silhouette is unremarkable for this low inspiratory examination with crowded vasculature markings. Flattened hemidiaphragms unchanged from prior radiograph. Calcified aortic arch. The lungs are clear without pleural effusions or focal consolidations. Trachea projects midline and there is no pneumothorax. Included soft tissue planes and osseous structures are non-suspicious. IMPRESSION: 1. No acute cardiopulmonary process. 2.  Aortic Atherosclerosis (ICD10-I70.0). Electronically Signed   By: Thana Farr.D.  On: 12/15/2017 00:26    Procedures Procedures (including critical care time)  Medications Ordered in ED Medications - No data to display   Initial Impression / Assessment and Plan / ED Course  I have reviewed the triage vital signs and the nursing notes. 77 y.o. male here with nasal congestion, sinus pressure and tenderness that started a week ago and has gotten worse despite using OTC medications. Since patient is allergic to Penicillin will treat with Doxycycline and Flonase. Patient to f/u with PCP or return here as needed for problems.   Final Clinical Impressions(s) / ED Diagnoses   Final diagnoses:  Acute maxillary sinusitis, recurrence not specified    ED Discharge Orders         Ordered    fluticasone (FLONASE) 50 MCG/ACT nasal spray  Daily     12/15/17 0149    doxycycline (VIBRAMYCIN) 100 MG capsule  2 times daily     12/15/17 0149           Debroah Baller [place], NP 12/15/17 0151    Ezequiel Essex, MD 12/15/17 2056

## 2017-12-17 ENCOUNTER — Emergency Department (HOSPITAL_COMMUNITY)
Admission: EM | Admit: 2017-12-17 | Discharge: 2018-01-12 | Disposition: E | Payer: Medicare Other | Attending: Emergency Medicine | Admitting: Emergency Medicine

## 2017-12-17 DIAGNOSIS — I129 Hypertensive chronic kidney disease with stage 1 through stage 4 chronic kidney disease, or unspecified chronic kidney disease: Secondary | ICD-10-CM | POA: Insufficient documentation

## 2017-12-17 DIAGNOSIS — R4020 Unspecified coma: Secondary | ICD-10-CM | POA: Diagnosis present

## 2017-12-17 DIAGNOSIS — I469 Cardiac arrest, cause unspecified: Secondary | ICD-10-CM | POA: Insufficient documentation

## 2017-12-17 DIAGNOSIS — F17210 Nicotine dependence, cigarettes, uncomplicated: Secondary | ICD-10-CM | POA: Diagnosis not present

## 2017-12-17 DIAGNOSIS — Z79899 Other long term (current) drug therapy: Secondary | ICD-10-CM | POA: Diagnosis not present

## 2017-12-17 DIAGNOSIS — N183 Chronic kidney disease, stage 3 (moderate): Secondary | ICD-10-CM | POA: Insufficient documentation

## 2017-12-17 DIAGNOSIS — Z8546 Personal history of malignant neoplasm of prostate: Secondary | ICD-10-CM | POA: Diagnosis not present

## 2017-12-17 DIAGNOSIS — I462 Cardiac arrest due to underlying cardiac condition: Secondary | ICD-10-CM

## 2017-12-17 DIAGNOSIS — Z7901 Long term (current) use of anticoagulants: Secondary | ICD-10-CM | POA: Insufficient documentation

## 2017-12-17 DIAGNOSIS — Z8673 Personal history of transient ischemic attack (TIA), and cerebral infarction without residual deficits: Secondary | ICD-10-CM | POA: Insufficient documentation

## 2017-12-17 MED ORDER — EPINEPHRINE PF 1 MG/10ML IJ SOSY
PREFILLED_SYRINGE | INTRAMUSCULAR | Status: AC | PRN
  Administered 2017-12-17 (×3): 10 mL via INTRAVENOUS

## 2017-12-17 MED ORDER — ATROPINE SULFATE 1 MG/ML IJ SOLN
INTRAMUSCULAR | Status: AC | PRN
  Administered 2017-12-17: 1 mg via INTRAVENOUS

## 2017-12-21 MED FILL — Medication: Qty: 1 | Status: AC

## 2018-01-12 NOTE — Code Documentation (Signed)
Patient time of death occurred at 44

## 2018-01-12 NOTE — ED Notes (Signed)
No pulse at pulse check.  Compressions continued, Dr. Sabra Heck talking to family.  ETCO2 15 at this time with cpr.

## 2018-01-12 NOTE — ED Notes (Signed)
Have just spoken with [facility]. Stated he is suitable for eye donation.

## 2018-01-12 NOTE — ED Notes (Signed)
Have notified family member to come find this nurse when the next of kin is here.

## 2018-01-12 NOTE — ED Notes (Signed)
Belongings taken by daughter.

## 2018-01-12 NOTE — Code Documentation (Signed)
Pharmacist here per DR. Miller's request for eval of tpa administration.

## 2018-01-12 NOTE — ED Notes (Signed)
Pt intubated with size 8.0 ett by Dr. Sabra Heck, Positive color change on co2 detector, breath sounds auscultated and secured at 27cm at lip.

## 2018-01-12 NOTE — ED Provider Notes (Signed)
[facility] EMERGENCY DEPARTMENT Provider Note   CSN: [id number] Arrival date & time: 01-15-18  1616     History   Chief Complaint Chief Complaint  Patient presents with  . Cardiac Arrest    HPI Ryan Rollins is a 77 y.o. male.  HPI  76 year old male, history of multiple significant medical problems including multiple brain aneurysms, multiple strokes, chronic kidney disease, hypercholesterolemia, hypertension, prostate cancer who presented to the hospital unresponsive in cardiac arrest.  Per the paramedics report who are the only historians at this time the patient had called out for dizziness, in fact it was his girlfriend who called after he had fallen, he refused transport initially but she called anyway.  The paramedics found the patient to be tripoding, having severe respiratory distress and within a very few minutes he went unresponsive and into cardiac arrest without pulses.  Their cardiac monitoring revealed that he had ST elevation MI and a "tombstone" format according to the paramedic.  They initially started CPR and continued CPR in route.  An airway device was placed, he was ventilated throughout his entire transport, 4 mg of epinephrine in total were given prehospital without return of spontaneous circulation.  Level 5 caveat applies secondary to the patient's critical and unresponsive condition.  Past Medical History:  Diagnosis Date  . Cerebral aneurysm   . Cerebrovascular disease 11/20/2014  . CKD (chronic kidney disease) stage 3, GFR 30-59 ml/min (HCC)   . Hypercholesterolemia   . Hypertension   . 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 Prostate cancer ([place])   . Stroke [facility])     Patient Active Problem List   Diagnosis Date Noted  . 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 Chest pain 11/20/2014  . Hypertension 11/20/2014  . Cerebrovascular disease 11/20/2014  . Cough 10/24/2014  . Hyperglycemia 10/24/2014    . Hypokalemia 10/24/2014  . Chronic kidney disease (CKD), stage IV (severe) ([place])   . Elevated troponin 10/23/2014    Past Surgical History:  Procedure Laterality Date  . ANKLE SURGERY    . CHOLECYSTECTOMY    . prostate surgery  biopsy          Home Medications    Prior to Admission medications   Medication Sig Start Date End Date Taking? Authorizing Provider  albuterol (PROVENTIL HFA;VENTOLIN HFA) 108 (90 BASE) MCG/ACT inhaler Inhale 2 puffs into the lungs every 6 (six) hours as needed for wheezing or shortness of breath.    [provider]  atorvastatin (LIPITOR) 10 MG tablet Take 10 mg by mouth daily.    [provider]  bisacodyl (LAXATIVE) 5 MG EC tablet Take 5 mg by mouth every other day.    [provider]  calcitRIOL (ROCALTROL) 0.25 MCG capsule Take 0.25 mcg by mouth every other day. Mon, Wed, Fri    [provider]  clopidogrel (PLAVIX) 75 MG tablet Take 75 mg by mouth daily.    [provider]  doxycycline (VIBRAMYCIN) 100 MG capsule Take 1 capsule (100 mg total) by mouth 2 (two) times daily. 12/15/17   Ashley Murrain, NP  enalapril (VASOTEC) 20 MG tablet Take 20 mg by mouth 2 (two) times daily.     [provider]  fluticasone (FLONASE) 50 MCG/ACT nasal spray Place 1 spray into both nostrils daily. 12/15/17   Ashley Murrain, NP  furosemide (LASIX) 40 MG tablet Take 20-40 mg by mouth daily as needed for fluid (take 1/2-1 tablet daily as  needed.).     [provider]  HYDROcodone-acetaminophen [facility]) 7.5-325 MG tablet  03/26/16   [provider]  hydrOXYzine (ATARAX/VISTARIL) 25 MG tablet  03/26/16   [provider]  levocetirizine (XYZAL) 5 MG tablet Take 5 mg by mouth every evening.    [provider]  NIFEdipine (PROCARDIA-XL/ADALAT CC) 60 MG 24 hr tablet Take 60 mg by mouth 2 (two) times daily.    [provider]  pantoprazole (PROTONIX) 40 MG tablet Take 40 mg by mouth 2  (two) times daily.    [provider]  predniSONE (DELTASONE) 20 MG tablet Take 2 tablets (40 mg total) by mouth daily. 01/31/17   Triplett, Tammy, PA-C  Tamsulosin HCl (FLOMAX) 0.4 MG CAPS Take 0.4 mg by mouth 2 (two) times daily.     [provider]    Family History Family History  Problem Relation Age of Onset  . Hypertension Mother   . Diabetes Mother   . Congestive Heart Failure Mother   . Hypertension Father     Social History Social History   Tobacco Use  . Smoking status: Current Every Day Smoker    Packs/day: 1.00    Years: 60.00    Pack years: 60.00    Types: Cigarettes  . Smokeless tobacco: Never Used  Substance Use Topics  . Alcohol use: Yes    Alcohol/week: 0.0 standard drinks    Comment: occ  . Drug use: No     Allergies   Morphine and related and Penicillins   Review of Systems Review of Systems  Unable to perform ROS: Acuity of condition     Physical Exam Updated Vital Signs There were no vitals taken for this visit.  Physical Exam  Constitutional:  Unresponsive apneic  HENT:  Pale skin, no signs of trauma  Eyes:  Nipples are 4 mm and unresponsive bilaterally  Neck:  Supple neck, no lymphadenopathy palpated  Cardiovascular:  No pulses, CPR required for circulation, edema mild on the right lower extremity, no edema to the left lower extremity  Pulmonary/Chest:  Lung sounds with ventilation with rhonchi  Abdominal:  Obese abdomen, no masses  Musculoskeletal:  Edema to the right lower extremity is mild, intraosseous line in the proximal tibia on the left  Neurological:  GCS of 3, unresponsive  Skin:  Pale, dry     ED Treatments / Results  Labs (all labs ordered are listed, but only abnormal results are displayed) Labs Reviewed - No data to display  EKG None  Radiology No results found.  Procedures Procedure Name: Intubation Date/Time: 12-28-17 4:38 PM Performed by: Noemi Chapel, MD Pre-anesthesia  Checklist: Patient being monitored and Suction available Oxygen Delivery Method: Ambu bag Preoxygenation: Pre-oxygenation with 100% oxygen Laryngoscope Size: Mac and 4 Grade View: Grade III Tube size: 8.0 mm Airway Equipment and Method: Stylet Placement Confirmation: ETT inserted through vocal cords under direct vision,  CO2 detector and Breath sounds checked- equal and bilateral Secured at: 24 cm Tube secured with: ETT holder Dental Injury: Teeth and Oropharynx as per pre-operative assessment  Difficulty Due To: Difficulty was anticipated      Procedure Note:  CPR: I personally directed CPR for cardiac arrest.   Medications Ordered in ED Medications  EPINEPHrine (ADRENALIN) 1 MG/10ML injection (10 mLs Intravenous Given December 28, 2017 1624)  atropine injection (1 mg Intravenous Given 12-28-2017 1623)     Initial Impression / Assessment and Plan / ED Course  I have reviewed the triage vital signs and the  nursing notes.  Pertinent labs & imaging results that were available during my care of the patient were reviewed by me and considered in my medical decision making (see chart for details).     Unfortunately the patient presented in cardiac arrest, this is likely secondary to myocardial infarction which was likely secondary to the patient's multiple comorbidities.  At this time the patient underwent resuscitative efforts in the emergency department with 3 more doses of epinephrine, a dose of atropine for bradycardia, there was no return of spontaneous circulation that lasted longer than 1 minute.  He continued to lose pulses.  Eventually the girlfriend arrived, after 40 minutes of resuscitative efforts the decision was made to discontinue CPR and within 30 seconds the patient went from pulseless electrical activity into asystole.  Time of death at 24.  At no time did receive ventricular fibrillation or a shockable rhythm.  I exchanged the EMS esophageal tube for an endotracheal tube and  conducted and directed CPR.  The family was updated on the patient's passing.  I personally signed the death certificate.  Final Clinical Impressions(s) / ED Diagnoses   Final diagnoses:  Cardiac arrest due to underlying cardiac condition [facility])      Noemi Chapel, MD 12/18/17 1531

## 2018-01-12 NOTE — ED Triage Notes (Signed)
EMS called out for dizziness to pt's home.  They arrived to find pt in respiratory distress.  EMS put pt on NRB, expiratory wheezing noted.  1 albuterol treatment given during transport.  PT looked up at ceiling and went unresponsive.  EMS says had elevation in all ekg leads and went pulseless.  CPR started.  4 doses of epi given pta.  Compressions continued upon arrival.  Reports PEA.

## 2018-01-12 DEATH — deceased

## 2018-10-31 IMAGING — DX DG BONE SURVEY MET
9 of 10 series · 9 of 10 positions shown · non-contrast
Comparison: None.

CLINICAL DATA: Monoclonal gammopathy of unspecified source

EXAM:
METASTATIC BONE SURVEY

[skull lat]
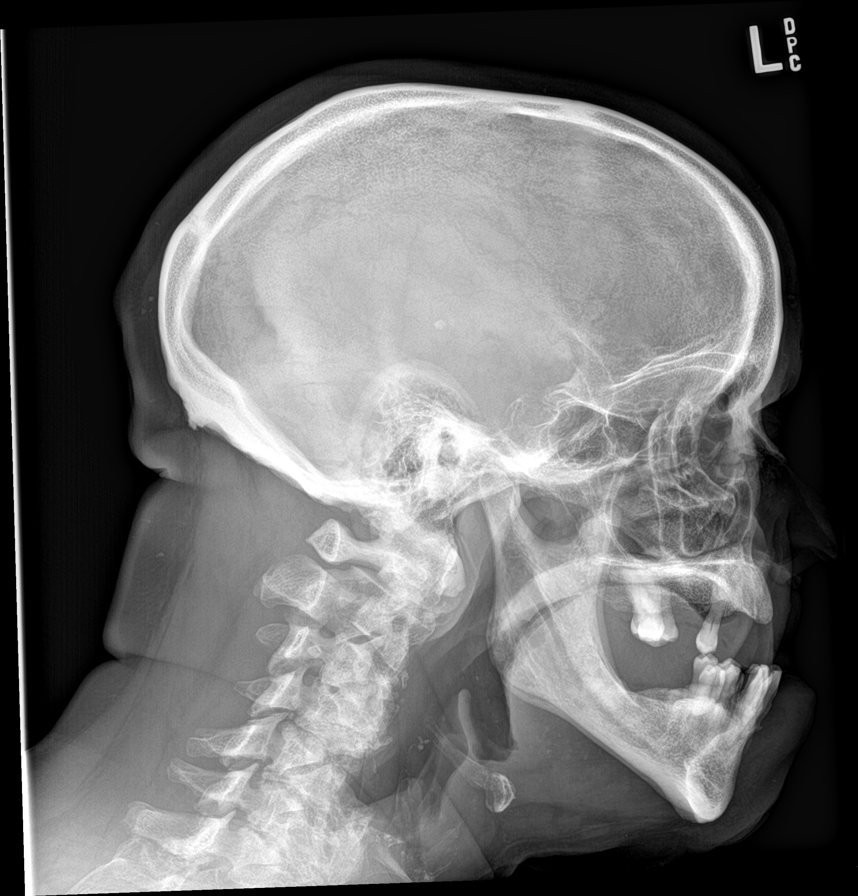

[shoulder ap (1 of 2)]
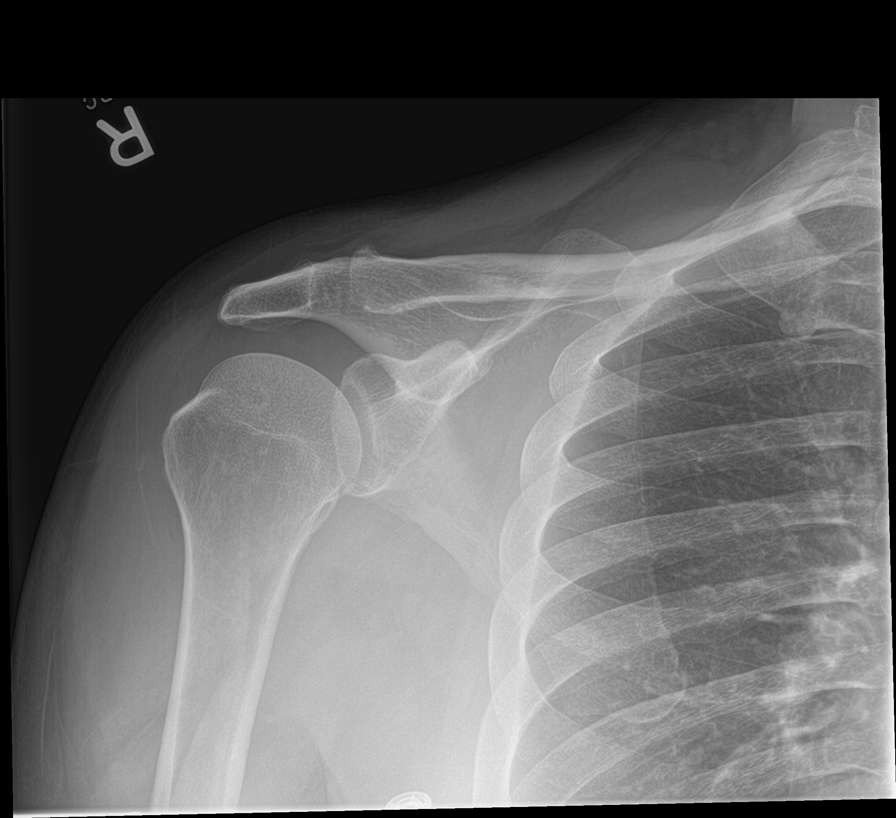

[shoulder ap (2 of 2)]
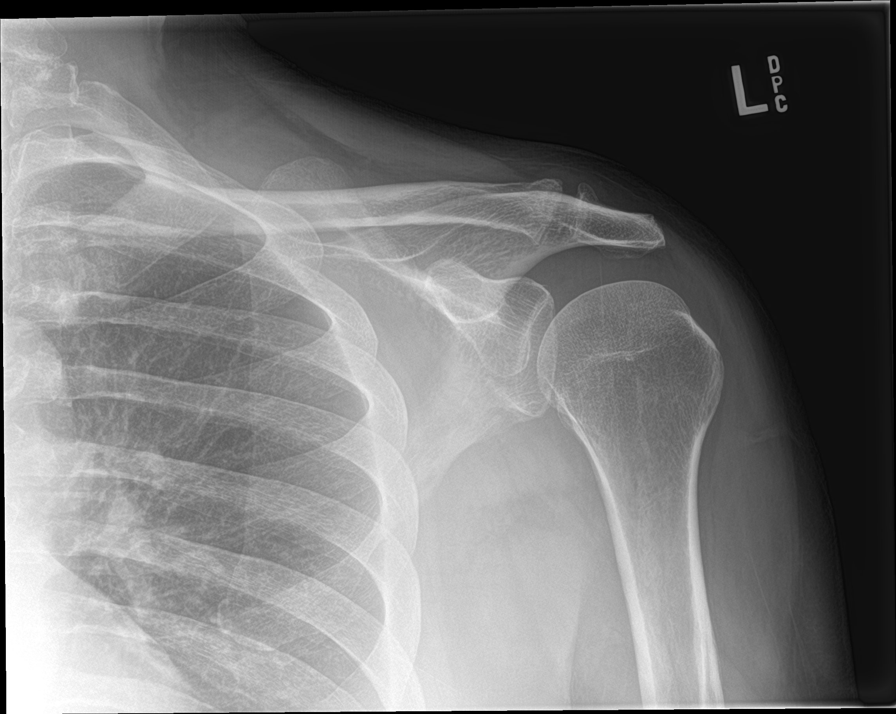

[humerus ap (1 of 2)]
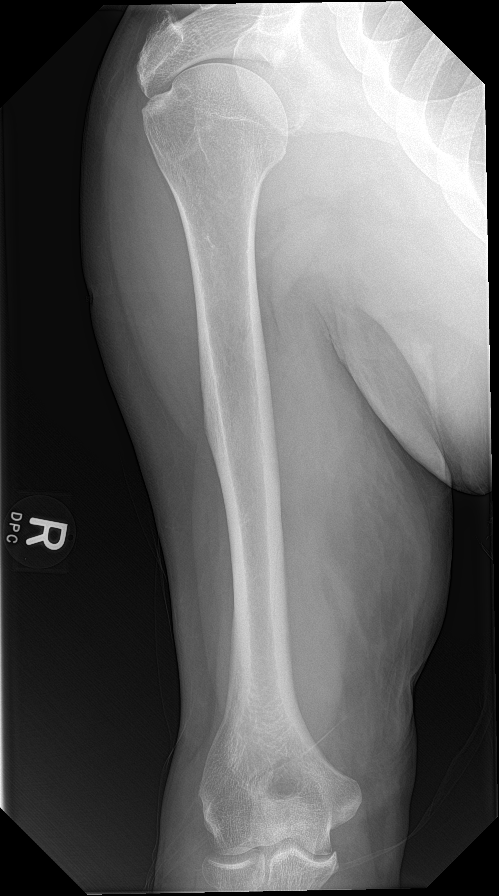

[humerus ap (2 of 2)]
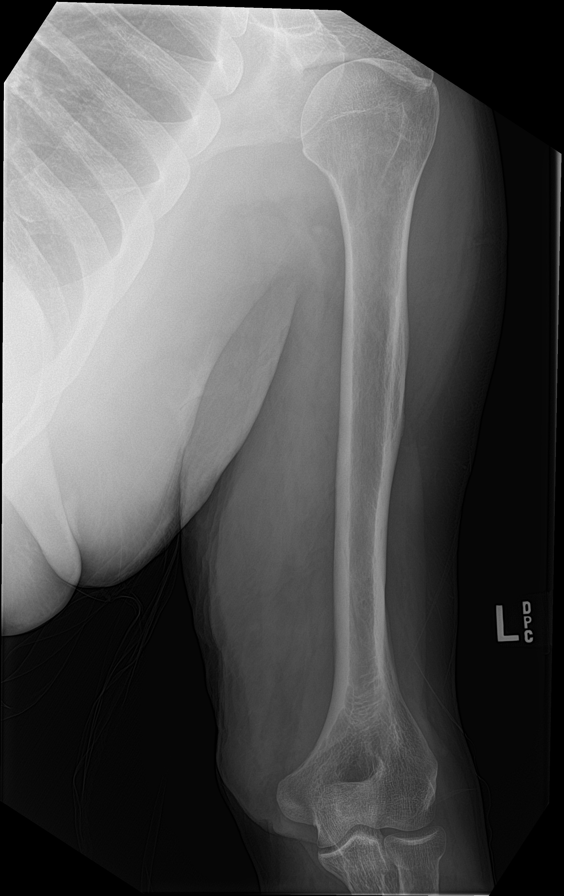

[forearm ap (1 of 2)]
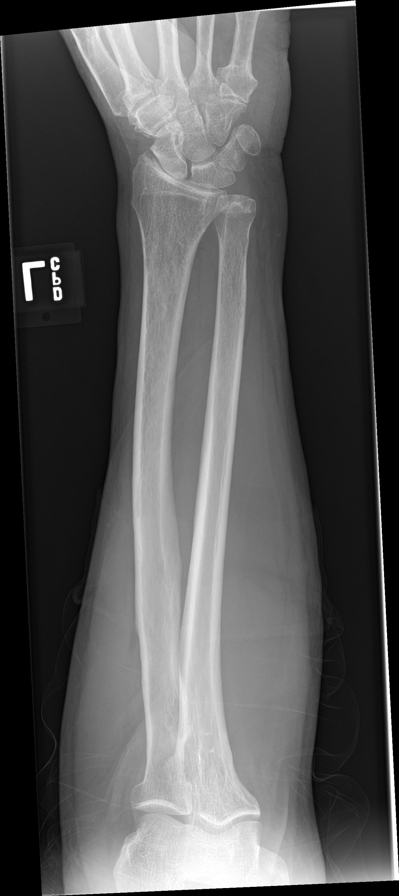

[forearm ap (2 of 2)]
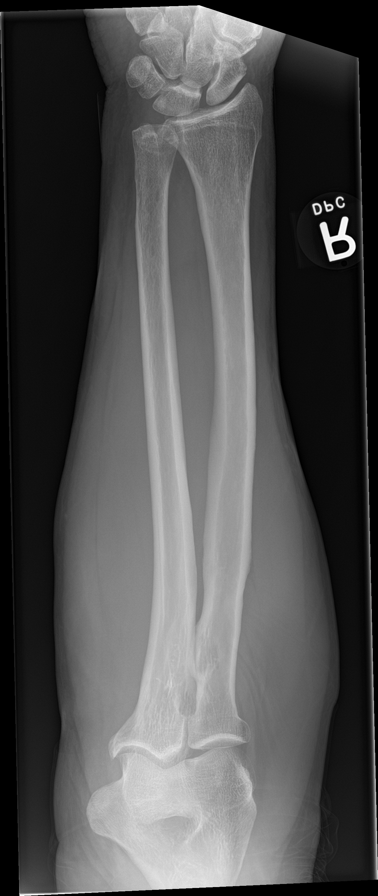

[c-spine ap]
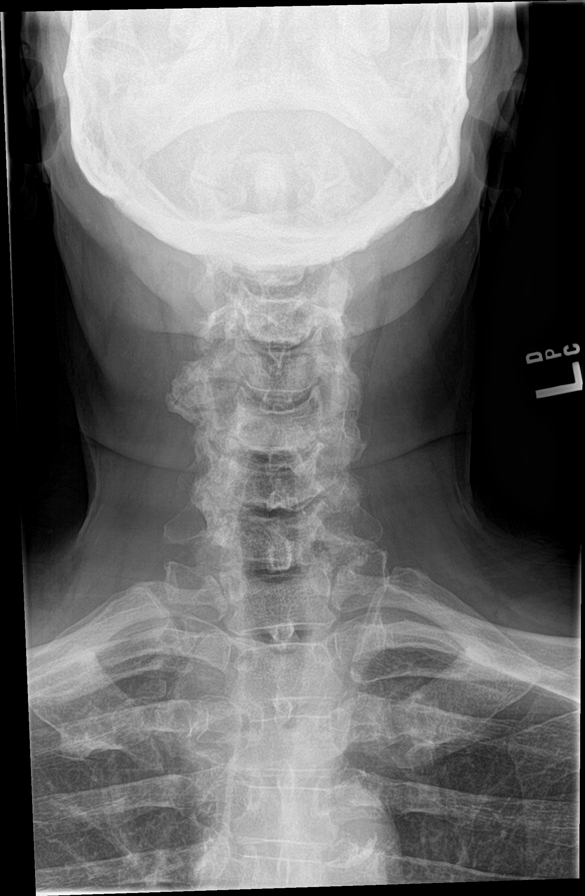

[c-spine lat]
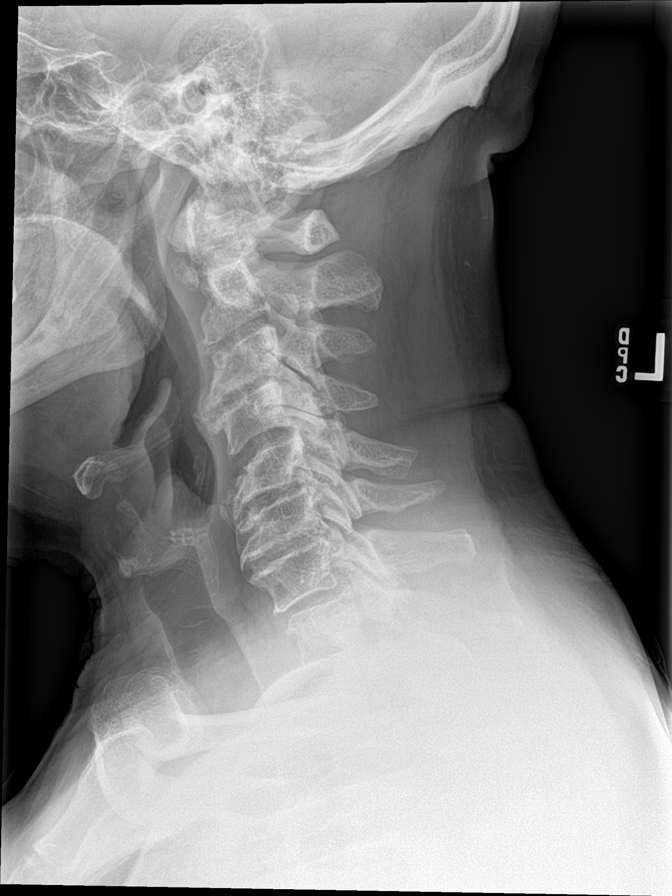

[9 of 10 positions shown; findings below may reference images not displayed]

FINDINGS: A lateral view the skull shows no bony abnormality.

Views of both shoulders show the glenohumeral joint spaces to be
normal. No lytic or blastic lesion is seen.

Views of both humerus show no lytic or blastic lesion.

The forearms are unremarkable.

Cervical vertebrae are straightened in alignment. There is
degenerative disc disease at C3-4, C5-6 and C6-7 levels. No
compression deformity is seen.

The thoracic vertebrae are normal alignment no lytic or blastic
lesion. Mild compression of T6 is noted of uncertain age.
Intervertebral disc spaces are unremarkable.

The lumbar vertebrae are straightened in alignment. There are
diffuse degenerative changes. No lytic or blastic lesion is seen.

The lungs are clear. Mediastinal and hilar contours are
unremarkable. The heart is within normal limits in size.

A view of the pelvis shows degenerative change in the hips right
greater than left. No lytic or blastic lesion is seen.

Views of the femurs show no lytic or blastic lesion. There is
degenerative change particular the right knee with loss of joint
space laterally.

Views of both lower legs show screws within the distal fibula and
partial fusion of the distal left fibula with distal right tibia but
no lytic or blastic lesion is seen.
IMPRESSION: 1. No lytic or blastic lesion is seen.
2. Mild compression deformity of T6 of uncertain age.
3. Degenerative disc disease at C3-4, C5-6 and C6-7. Degenerative
change diffusely throughout the thoracic and lumbar spine.
4. No active lung disease.
